# Patient Record
Sex: Female | Born: 1960
Health system: Southern US, Community
[De-identification: ages and names within clinical notes are randomized; demographics above are authoritative.]

## PROBLEM LIST (undated history)

## (undated) DIAGNOSIS — R55 Syncope and collapse: Secondary | ICD-10-CM

## (undated) DIAGNOSIS — M8580 Other specified disorders of bone density and structure, unspecified site: Secondary | ICD-10-CM

## (undated) DIAGNOSIS — F329 Major depressive disorder, single episode, unspecified: Secondary | ICD-10-CM

## (undated) DIAGNOSIS — E7800 Pure hypercholesterolemia, unspecified: Secondary | ICD-10-CM

## (undated) DIAGNOSIS — F32A Depression, unspecified: Secondary | ICD-10-CM

## (undated) DIAGNOSIS — C439 Malignant melanoma of skin, unspecified: Secondary | ICD-10-CM

## (undated) HISTORY — PX: TUBAL LIGATION: SHX77

## (undated) HISTORY — PX: MELANOMA EXCISION: SHX5266

## (undated) HISTORY — PX: OTHER SURGICAL HISTORY: SHX169

## (undated) HISTORY — DX: Pure hypercholesterolemia, unspecified: E78.00

## (undated) HISTORY — DX: Major depressive disorder, single episode, unspecified: F32.9

## (undated) HISTORY — DX: Depression, unspecified: F32.A

## (undated) HISTORY — DX: Other specified disorders of bone density and structure, unspecified site: M85.80

## (undated) HISTORY — PX: HYSTEROSCOPY: SHX211

## (undated) HISTORY — PX: DILATION AND CURETTAGE OF UTERUS: SHX78

## (undated) HISTORY — DX: Syncope and collapse: R55

## (undated) HISTORY — DX: Malignant melanoma of skin, unspecified: C43.9

## (undated) HISTORY — PX: BUNIONECTOMY: SHX129

---

## 1998-04-19 ENCOUNTER — Ambulatory Visit (HOSPITAL_COMMUNITY): Admission: RE | Admit: 1998-04-19 | Discharge: 1998-04-19 | Payer: Self-pay | Admitting: Obstetrics and Gynecology

## 1998-04-23 ENCOUNTER — Ambulatory Visit (HOSPITAL_COMMUNITY): Admission: RE | Admit: 1998-04-23 | Discharge: 1998-04-23 | Payer: Self-pay | Admitting: Obstetrics and Gynecology

## 1998-10-01 ENCOUNTER — Ambulatory Visit (HOSPITAL_COMMUNITY): Admission: RE | Admit: 1998-10-01 | Discharge: 1998-10-01 | Payer: Self-pay | Admitting: Family Medicine

## 1998-10-01 ENCOUNTER — Encounter: Payer: Self-pay | Admitting: Family Medicine

## 1999-08-01 HISTORY — PX: VAGINAL HYSTERECTOMY: SUR661

## 1999-10-18 ENCOUNTER — Ambulatory Visit (HOSPITAL_COMMUNITY): Admission: RE | Admit: 1999-10-18 | Discharge: 1999-10-18 | Payer: Self-pay | Admitting: Obstetrics and Gynecology

## 1999-10-18 ENCOUNTER — Encounter: Payer: Self-pay | Admitting: Obstetrics and Gynecology

## 2000-04-06 ENCOUNTER — Inpatient Hospital Stay (HOSPITAL_COMMUNITY): Admission: RE | Admit: 2000-04-06 | Discharge: 2000-04-07 | Payer: Self-pay | Admitting: Obstetrics and Gynecology

## 2000-10-23 ENCOUNTER — Ambulatory Visit (HOSPITAL_COMMUNITY): Admission: RE | Admit: 2000-10-23 | Discharge: 2000-10-23 | Payer: Self-pay | Admitting: Obstetrics and Gynecology

## 2000-10-23 ENCOUNTER — Encounter: Payer: Self-pay | Admitting: Obstetrics and Gynecology

## 2001-02-13 ENCOUNTER — Other Ambulatory Visit: Admission: RE | Admit: 2001-02-13 | Discharge: 2001-02-13 | Payer: Self-pay | Admitting: Obstetrics and Gynecology

## 2001-03-27 ENCOUNTER — Ambulatory Visit (HOSPITAL_BASED_OUTPATIENT_CLINIC_OR_DEPARTMENT_OTHER): Admission: RE | Admit: 2001-03-27 | Discharge: 2001-03-27 | Payer: Self-pay | Admitting: *Deleted

## 2001-12-17 ENCOUNTER — Encounter: Payer: Self-pay | Admitting: Obstetrics and Gynecology

## 2001-12-17 ENCOUNTER — Ambulatory Visit (HOSPITAL_COMMUNITY): Admission: RE | Admit: 2001-12-17 | Discharge: 2001-12-17 | Payer: Self-pay | Admitting: Obstetrics and Gynecology

## 2002-02-26 ENCOUNTER — Other Ambulatory Visit: Admission: RE | Admit: 2002-02-26 | Discharge: 2002-02-26 | Payer: Self-pay | Admitting: Obstetrics and Gynecology

## 2003-01-23 ENCOUNTER — Ambulatory Visit (HOSPITAL_COMMUNITY): Admission: RE | Admit: 2003-01-23 | Discharge: 2003-01-23 | Payer: Self-pay | Admitting: Obstetrics and Gynecology

## 2003-01-23 ENCOUNTER — Encounter: Payer: Self-pay | Admitting: Obstetrics and Gynecology

## 2003-03-25 ENCOUNTER — Other Ambulatory Visit: Admission: RE | Admit: 2003-03-25 | Discharge: 2003-03-25 | Payer: Self-pay | Admitting: Obstetrics and Gynecology

## 2004-01-26 ENCOUNTER — Ambulatory Visit (HOSPITAL_COMMUNITY): Admission: RE | Admit: 2004-01-26 | Discharge: 2004-01-26 | Payer: Self-pay | Admitting: Obstetrics and Gynecology

## 2004-05-04 ENCOUNTER — Other Ambulatory Visit: Admission: RE | Admit: 2004-05-04 | Discharge: 2004-05-04 | Payer: Self-pay | Admitting: Obstetrics and Gynecology

## 2005-01-30 ENCOUNTER — Ambulatory Visit (HOSPITAL_COMMUNITY): Admission: RE | Admit: 2005-01-30 | Discharge: 2005-01-30 | Payer: Self-pay | Admitting: Obstetrics and Gynecology

## 2005-05-05 ENCOUNTER — Other Ambulatory Visit: Admission: RE | Admit: 2005-05-05 | Discharge: 2005-05-05 | Payer: Self-pay | Admitting: Obstetrics and Gynecology

## 2006-04-04 ENCOUNTER — Ambulatory Visit (HOSPITAL_COMMUNITY): Admission: RE | Admit: 2006-04-04 | Discharge: 2006-04-04 | Payer: Self-pay | Admitting: Obstetrics and Gynecology

## 2006-05-09 ENCOUNTER — Other Ambulatory Visit: Admission: RE | Admit: 2006-05-09 | Discharge: 2006-05-09 | Payer: Self-pay | Admitting: Obstetrics and Gynecology

## 2007-04-10 ENCOUNTER — Ambulatory Visit (HOSPITAL_COMMUNITY): Admission: RE | Admit: 2007-04-10 | Discharge: 2007-04-10 | Payer: Self-pay | Admitting: Obstetrics and Gynecology

## 2007-05-15 ENCOUNTER — Other Ambulatory Visit: Admission: RE | Admit: 2007-05-15 | Discharge: 2007-05-15 | Payer: Self-pay | Admitting: Obstetrics and Gynecology

## 2007-08-19 ENCOUNTER — Ambulatory Visit: Payer: Self-pay | Admitting: Gastroenterology

## 2008-05-04 ENCOUNTER — Ambulatory Visit (HOSPITAL_COMMUNITY): Admission: RE | Admit: 2008-05-04 | Discharge: 2008-05-04 | Payer: Self-pay | Admitting: Obstetrics and Gynecology

## 2008-05-11 ENCOUNTER — Encounter: Admission: RE | Admit: 2008-05-11 | Discharge: 2008-05-11 | Payer: Self-pay | Admitting: Obstetrics and Gynecology

## 2008-05-25 ENCOUNTER — Encounter: Payer: Self-pay | Admitting: Obstetrics and Gynecology

## 2008-05-25 ENCOUNTER — Ambulatory Visit: Payer: Self-pay | Admitting: Obstetrics and Gynecology

## 2008-05-25 ENCOUNTER — Other Ambulatory Visit: Admission: RE | Admit: 2008-05-25 | Discharge: 2008-05-25 | Payer: Self-pay | Admitting: Obstetrics and Gynecology

## 2009-05-17 ENCOUNTER — Ambulatory Visit (HOSPITAL_COMMUNITY): Admission: RE | Admit: 2009-05-17 | Discharge: 2009-05-17 | Payer: Self-pay | Admitting: Obstetrics and Gynecology

## 2009-05-31 ENCOUNTER — Ambulatory Visit: Payer: Self-pay | Admitting: Obstetrics and Gynecology

## 2009-05-31 ENCOUNTER — Encounter: Payer: Self-pay | Admitting: Obstetrics and Gynecology

## 2009-05-31 ENCOUNTER — Other Ambulatory Visit: Admission: RE | Admit: 2009-05-31 | Discharge: 2009-05-31 | Payer: Self-pay | Admitting: Obstetrics and Gynecology

## 2009-06-30 ENCOUNTER — Ambulatory Visit: Payer: Self-pay | Admitting: Obstetrics and Gynecology

## 2010-05-18 ENCOUNTER — Ambulatory Visit (HOSPITAL_COMMUNITY): Admission: RE | Admit: 2010-05-18 | Discharge: 2010-05-18 | Payer: Self-pay | Admitting: Obstetrics and Gynecology

## 2010-06-01 ENCOUNTER — Ambulatory Visit: Payer: Self-pay | Admitting: Obstetrics and Gynecology

## 2010-06-01 ENCOUNTER — Other Ambulatory Visit: Admission: RE | Admit: 2010-06-01 | Discharge: 2010-06-01 | Payer: Self-pay | Admitting: Obstetrics and Gynecology

## 2010-08-21 ENCOUNTER — Encounter: Payer: Self-pay | Admitting: Orthopedic Surgery

## 2010-08-21 ENCOUNTER — Encounter: Payer: Self-pay | Admitting: Family Medicine

## 2010-08-28 ENCOUNTER — Encounter
Admission: RE | Admit: 2010-08-28 | Discharge: 2010-08-28 | Payer: Self-pay | Source: Home / Self Care | Attending: Orthopedic Surgery | Admitting: Orthopedic Surgery

## 2010-10-17 ENCOUNTER — Other Ambulatory Visit: Payer: Self-pay | Admitting: Dermatology

## 2010-12-16 NOTE — Op Note (Signed)
[facility]  Patient:    Judy Mejia, Judy Mejia                    MRN: [id number] Proc. Date: 04/06/00 Adm. Date:  [id number] Attending:  Sharon Mt                           Operative Report  PREOPERATIVE DIAGNOSES:  Dysfunctional uterine bleeding, dysmenorrhea, adenomyosis suspected.  POSTOPERATIVE DIAGNOSES:  Dysfunctional uterine bleeding, dysmenorrhea, adenomyosis suspected.  OPERATION PERFORMED:  Vaginal hysterectomy.  SURGEON:  Dr. Eda Paschal.  FIRST ASSISTANT:  Dr. Audie Box.  ANESTHESIA:  General endotracheal.  FINDINGS:  External and vaginal is within normal limits, cervix is clean. The uterus is normal size and shape. The tubes and ovaries were free of any disease. The pelvic peritoneum was free of any disease.  DESCRIPTION OF PROCEDURE:  After adequate general endotracheal anesthesia, the patient was placed in the dorsal lithotomy position, prepped and draped in the usual sterile fashion. A 1:200,000 solution of epinephrine and 0.5% xylocaine was injected around the cervix. A 360 degree incision was made around the cervix, the bladder was mobilized superiorly as was the posterior peritoneum. The posterior peritoneum and vesicouterine fold to peritoneum were entered by sharp dissection. The uterosacral ligaments were clamped. In clamping them, they were shortened. They were then sutured to the vaginal cuff laterally for good vault support. The cardinal ligaments, uterine arteries, balance of the broad ligament, utero-ovarian ligaments, round ligaments, and fallopian tubes were all successfully clamped, cut and suture ligated. All major vascular bundles were doubly ligated. The suture material for all the above mentioned pedicles was #1 chromic catgut. The uterus was delivered intact, sent to pathology for tissue diagnosis. The adnexa were free of any disease. The vaginal cuff was sutured to the posterior peritoneum with a running  #0 Vicryl. Copious irrigation was done with Ringers lactate. A modified McCall enterocele prevention suture was placed with 2-0 Vicryl and then the cuff and peritoneum was closed with interrupteds of #1 chromic catgut. The enterocele prevention suture was tied in place. The bladder was emptied and drained clear urine and it was removed. The patient left the operating room in satisfactory condition. DD:  04/06/00 TD:  04/07/00 Job: [id number]

## 2010-12-16 NOTE — Op Note (Signed)
[place]  Patient:    Judy Mejia, Judy Mejia Visit Number: [id number] MRN: [id number]          Service Type: DSU Location: [facility] Attending Physician:  Vikki Ports Proc. Date: 03/27/01 Adm. Date:  03/27/2001                             Operative Report  PREOPERATIVE DIAGNOSIS:  Fistula in ano.  POSTOPERATIVE DIAGNOSIS:  Fistula in ano.  PROCEDURE:  Fistulotomy.  SURGEON:  Vikki Ports, M.D.  ANESTHESIA:  General.  DESCRIPTION OF PROCEDURE:  The patient was taken to the operating room and placed in supine position after adequate anesthesia was induced.  Using laryngeal mask the patient was placed in lithotomy position.  Perianal prep was undertaken.  A Hill-Ferguson retractor was placed within the rectum. External fistula opening was identified, probed with a rectal probe, found to go submuscularly behind the internal sphincter.  This was opened with Bovie electrocautery.  All tissues were injected 0.5 Marcaine with epinephrine. Adequate hemostasis was insured.  Gelfoam packing was placed.  The patient tolerated the procedure well and went to PACU in good condition. Attending Physician:  Vikki Ports DD:  03/27/01 TD:  03/27/01 Job: [id number]

## 2010-12-16 NOTE — Discharge Summary (Signed)
[facility]  Patient:    Judy Mejia, Judy Mejia                    MRN: [id number] Adm. Date:  [id number] Disc. Date: [id number] Attending:  Sharon Mt                           Discharge Summary  HISTORY OF PRESENT ILLNESS:  The patient is a 50 year old female with refractory dysfunction uterine bleeding and dysmenorrhea with adenomyosis suspected who entered the hospital for a vaginal hysterectomy.  HOSPITAL COURSE:  On the day of admission a vaginal hysterectomy was performed without difficulty.  On the first postoperative day she was voiding well, tolerating an oral diet, and was without complaints.  She was discharged on Tylox for pain relief.  Diet was soft, to advance as tolerated.  Activity was ambulatory.  Final pathology report is not ready at the time of dictation. She is to return to our office in two weeks for follow-up.  She will call for an appointment.  CONDITION ON DISCHARGE:  Improved.  DISCHARGE DIAGNOSES: 1. Dysfunctional uterine bleeding. 2. Dysmenorrhea. 3. Adenomyosis suspected.  OPERATIONS:  Vaginal hysterectomy. DD:  04/07/00 TD:  04/09/00 Job: [id number]

## 2010-12-16 NOTE — H&P (Signed)
[facility]  Patient:    Judy Mejia, Judy Mejia                         MRN: [id number] Attending:  Rande Brunt. Eda Paschal, M.D.                         History and Physical  CHIEF COMPLAINT:  Menometrorrhagia and dysmenorrhea.  HISTORY OF PRESENT ILLNESS:  The patient is a 50 year old gravida 2, para 2, AB 0, who persists in having severe dysmenorrhea as well as severe menorrhagia.  This has become more and more incapacitating for her.  She has been treated with several different types of oral contraceptives without relief.  She has used nonsteroidal anti-inflammatory drugs as well without any relief.  She had a sonohysterogram, which failed to reveal any significant intrauterine pathology to explain the above.  It is felt that her most likely diagnosis is adenomyosis.  As a result of persistent menometrorrhagia, dysmenorrhea nonresponsive to medical therapy, she now enters the hospital for a vaginal hysterectomy.  She has given me permission to remove one or both of her ovaries for very significant, though our plan since she is only 58 is to try to preserve both her ovaries.  PAST MEDICAL HISTORY:  She is currently on Zoloft and Wellbutrin for depression.  She also takes cyclobenzaprine.  She has only had bunion surgery.  ALLERGIES:  She is allergic to no drugs.  FAMILY HISTORY:  Negative for breast, colon, ovarian, or uterine cancer. Negative for diabetes, hypertension, and heart disease.  SOCIAL HISTORY:  She is a nonsmoker.  She rarely uses alcohol.  She has one up of coffee per day and one coke per day.  REVIEW OF SYSTEMS:  HEENT:  Negative except for a history of headaches and lightheadedness.  CARDIAC:  Negative.  GASTROINTESTINAL:  Gas and midepigastric discomfort.  GENITOURINARY:  Negative.  NEUROMUSCULAR:  History of depression. ALLERGIC, IMMUNOLOGIC, LYMPHATIC, ENDOCRINE:  Basically negative.  PHYSICAL EXAMINATION:  GENERAL:  The patient is a  well-developed and well-nourished female in no acute distress.  VITAL SIGNS:  Blood pressure is 118/76, pulse is 80 and regular, respirations 16 and nonlabored.  She is afebrile.  HEENT:  All within normal limits.  NECK:  Supple, trachea was in the midline.  Thyroid was not enlarged.  LUNGS:  Clear to P&A.  HEART:  No thrills, heaves, or murmurs.  BREASTS:  No masses.  ABDOMEN:  Soft without guarding, rebound, or masses.  PELVIC:  External and vaginal is within normal limits.  Cervix is clean. Uterus is anteverted and normal size and shape.  Adnexa are palpably normal. Rectal is negative.  EXTREMITIES:  Within normal limits.  DIAGNOSTIC EVALUATION:  Please note that although the patient had a normal sonohysterogram now, in July 1998 she did have an endometrial polyp, which was removed by [facility] and hysteroscopy, but her above problems have persisted.  IMPRESSION:  Persistent menometrorrhagia and dysmenorrhea.  PLAN:  Vaginal hysterectomy for treatment of the above. DD:  04/05/00 TD:  04/06/00 Job: [id number]

## 2011-05-02 ENCOUNTER — Other Ambulatory Visit: Payer: Self-pay | Admitting: Obstetrics and Gynecology

## 2011-05-02 DIAGNOSIS — Z1231 Encounter for screening mammogram for malignant neoplasm of breast: Secondary | ICD-10-CM

## 2011-05-31 ENCOUNTER — Encounter: Payer: Self-pay | Admitting: Gynecology

## 2011-06-09 ENCOUNTER — Ambulatory Visit (INDEPENDENT_AMBULATORY_CARE_PROVIDER_SITE_OTHER): Payer: Managed Care, Other (non HMO) | Admitting: Obstetrics and Gynecology

## 2011-06-09 ENCOUNTER — Other Ambulatory Visit (HOSPITAL_COMMUNITY)
Admission: RE | Admit: 2011-06-09 | Discharge: 2011-06-09 | Disposition: A | Discharge status: Home / Self Care | Payer: Managed Care, Other (non HMO) | Source: Ambulatory Visit | Attending: Obstetrics and Gynecology | Admitting: Obstetrics and Gynecology

## 2011-06-09 ENCOUNTER — Encounter: Payer: Self-pay | Admitting: Obstetrics and Gynecology

## 2011-06-09 ENCOUNTER — Ambulatory Visit (HOSPITAL_COMMUNITY)
Admission: RE | Admit: 2011-06-09 | Discharge: 2011-06-09 | Disposition: A | Discharge status: Home / Self Care | Payer: Managed Care, Other (non HMO) | Source: Ambulatory Visit | Attending: Obstetrics and Gynecology | Admitting: Obstetrics and Gynecology

## 2011-06-09 VITALS — BP 120/70 | Ht 63.0 in | Wt 123.0 lb

## 2011-06-09 DIAGNOSIS — Z01419 Encounter for gynecological examination (general) (routine) without abnormal findings: Secondary | ICD-10-CM | POA: Insufficient documentation

## 2011-06-09 DIAGNOSIS — R140 Abdominal distension (gaseous): Secondary | ICD-10-CM | POA: Insufficient documentation

## 2011-06-09 DIAGNOSIS — M8580 Other specified disorders of bone density and structure, unspecified site: Secondary | ICD-10-CM

## 2011-06-09 DIAGNOSIS — M899 Disorder of bone, unspecified: Secondary | ICD-10-CM

## 2011-06-09 DIAGNOSIS — R141 Gas pain: Secondary | ICD-10-CM

## 2011-06-09 DIAGNOSIS — E7800 Pure hypercholesterolemia, unspecified: Secondary | ICD-10-CM

## 2011-06-09 DIAGNOSIS — Z833 Family history of diabetes mellitus: Secondary | ICD-10-CM

## 2011-06-09 DIAGNOSIS — Z1231 Encounter for screening mammogram for malignant neoplasm of breast: Secondary | ICD-10-CM | POA: Insufficient documentation

## 2011-06-09 NOTE — Progress Notes (Signed)
The patient came back to see me today for her annual GYN exam. For a while now she's been treated for abdominal bloating by her GI doctor. She is still having a problem as the medication so far has not worked. She has low bone mass without an elevated FRAX risk. She is due for followup bone density. She is up-to-date on mammograms. Several years ago she had an elevated FSH but is asymptomatic. Last year her lipid profile showed an elevated triglyceride 179 and elevated LDL of 149( total cholesterol 234). She did not fast today. She is having no vaginal bleeding.  HEENT: Within normal limits. Kennon Portela present Neck: No masses. Supraclavicular lymph nodes: Not enlarged. Breasts: Examined in both sitting and lying position. Symmetrical without skin changes or masses. Abdomen: Soft no masses guarding or rebound. No hernias. Pelvic: External within normal limits. BUS within normal limits. Vaginal examination shows good estrogen effect, no cystocele enterocele or rectocele. Cervix and uterus absent. Adnexa within normal limits. Rectovaginal confirmatory. Extremities within normal limits.   Assessment: Abdominal bloating. Elevated cholesterol. Osteopenia.  Plan: Return for bone density, pelvic ultrasound, and lab work when she is fasted. Please note that I could not find this year's  mammogram in  chart. We will ask her about a when I see her for ultrasound.

## 2011-06-15 ENCOUNTER — Other Ambulatory Visit: Payer: Self-pay | Admitting: Gastroenterology

## 2011-06-15 ENCOUNTER — Ambulatory Visit (INDEPENDENT_AMBULATORY_CARE_PROVIDER_SITE_OTHER): Payer: Managed Care, Other (non HMO) | Admitting: Obstetrics and Gynecology

## 2011-06-15 DIAGNOSIS — M949 Disorder of cartilage, unspecified: Secondary | ICD-10-CM

## 2011-06-15 DIAGNOSIS — Z833 Family history of diabetes mellitus: Secondary | ICD-10-CM

## 2011-06-15 DIAGNOSIS — E7800 Pure hypercholesterolemia, unspecified: Secondary | ICD-10-CM

## 2011-06-15 DIAGNOSIS — M899 Disorder of bone, unspecified: Secondary | ICD-10-CM

## 2011-06-15 DIAGNOSIS — M8580 Other specified disorders of bone density and structure, unspecified site: Secondary | ICD-10-CM

## 2011-06-15 NOTE — Progress Notes (Signed)
Addended by: Landis Martins R on: 06/15/2011 10:04 AM   Modules accepted: Orders

## 2011-06-27 ENCOUNTER — Other Ambulatory Visit: Payer: Managed Care, Other (non HMO)

## 2011-06-29 ENCOUNTER — Ambulatory Visit
Admission: RE | Admit: 2011-06-29 | Discharge: 2011-06-29 | Disposition: A | Discharge status: Home / Self Care | Payer: Managed Care, Other (non HMO) | Source: Ambulatory Visit | Attending: Gastroenterology | Admitting: Gastroenterology

## 2011-06-29 MED ORDER — IOHEXOL 300 MG/ML  SOLN
100.0000 mL | Freq: Once | INTRAMUSCULAR | Status: AC | PRN
  Administered 2011-06-29: 100 mL via INTRAVENOUS

## 2011-07-05 ENCOUNTER — Ambulatory Visit: Payer: Managed Care, Other (non HMO) | Admitting: Obstetrics and Gynecology

## 2011-07-05 ENCOUNTER — Other Ambulatory Visit: Payer: Managed Care, Other (non HMO)

## 2011-07-13 ENCOUNTER — Ambulatory Visit (INDEPENDENT_AMBULATORY_CARE_PROVIDER_SITE_OTHER): Payer: Managed Care, Other (non HMO)

## 2011-07-13 DIAGNOSIS — M8580 Other specified disorders of bone density and structure, unspecified site: Secondary | ICD-10-CM

## 2011-07-13 DIAGNOSIS — M899 Disorder of bone, unspecified: Secondary | ICD-10-CM

## 2011-07-20 ENCOUNTER — Ambulatory Visit: Payer: Managed Care, Other (non HMO) | Admitting: Obstetrics and Gynecology

## 2011-07-20 ENCOUNTER — Other Ambulatory Visit: Payer: Managed Care, Other (non HMO)

## 2012-05-01 ENCOUNTER — Other Ambulatory Visit: Payer: Self-pay | Admitting: Obstetrics and Gynecology

## 2012-05-01 DIAGNOSIS — Z1231 Encounter for screening mammogram for malignant neoplasm of breast: Secondary | ICD-10-CM

## 2012-06-21 ENCOUNTER — Ambulatory Visit (HOSPITAL_COMMUNITY)
Admission: RE | Admit: 2012-06-21 | Discharge: 2012-06-21 | Disposition: A | Discharge status: Home / Self Care | Payer: Managed Care, Other (non HMO) | Source: Ambulatory Visit | Attending: Obstetrics and Gynecology | Admitting: Obstetrics and Gynecology

## 2012-06-21 ENCOUNTER — Ambulatory Visit (INDEPENDENT_AMBULATORY_CARE_PROVIDER_SITE_OTHER): Payer: Managed Care, Other (non HMO) | Admitting: Obstetrics and Gynecology

## 2012-06-21 ENCOUNTER — Encounter: Payer: Self-pay | Admitting: Obstetrics and Gynecology

## 2012-06-21 VITALS — BP 112/66 | Ht 63.0 in | Wt 126.0 lb

## 2012-06-21 DIAGNOSIS — F32A Depression, unspecified: Secondary | ICD-10-CM | POA: Insufficient documentation

## 2012-06-21 DIAGNOSIS — Z1231 Encounter for screening mammogram for malignant neoplasm of breast: Secondary | ICD-10-CM | POA: Insufficient documentation

## 2012-06-21 DIAGNOSIS — F329 Major depressive disorder, single episode, unspecified: Secondary | ICD-10-CM | POA: Insufficient documentation

## 2012-06-21 DIAGNOSIS — N8111 Cystocele, midline: Secondary | ICD-10-CM

## 2012-06-21 DIAGNOSIS — Z01419 Encounter for gynecological examination (general) (routine) without abnormal findings: Secondary | ICD-10-CM

## 2012-06-21 DIAGNOSIS — IMO0002 Reserved for concepts with insufficient information to code with codable children: Secondary | ICD-10-CM

## 2012-06-21 MED ORDER — ESTRADIOL 0.05 MG/24HR TD PTWK: 1.0000 | MEDICATED_PATCH | TRANSDERMAL | Status: DC

## 2012-06-21 NOTE — Patient Instructions (Signed)
Bone density in December, 2014. Continue yearly mammograms.

## 2012-06-21 NOTE — Progress Notes (Signed)
Patient came to see me today for her annual GYN exam. In 2009 she had an elevated FSH. She is now complaining of hot flashes. She has tried over-the-counter products without help. She is having no vaginal bleeding or pelvic pain. She had a vaginal hysterectomy for dysfunctional uterine bleeding in 2001. She has never had an abnormal Pap smear. She has osteopenia on bone density without an elevated fracture risk. She's had no fractures. Her bone density was in 2012. She's never had an abnormal Pap smear. Her last Pap smear was 2012. She is noticing that sometimes she has trouble emptying her bladder. She also is having more trouble with vaginal dryness. She does her lab through her PCP. She had her mammogram today.  HEENT: Within normal limits.Kennon Portela present. Neck: No masses. Supraclavicular lymph nodes: Not enlarged. Breasts: Examined in both sitting and lying position. Symmetrical without skin changes or masses. Abdomen: Soft no masses guarding or rebound. No hernias. Pelvic: External within normal limits. BUS within normal limits. Vaginal examination shows good estrogen effect, first degree cystocele. no  enterocele or rectocele. Cervix and uterus absent. Adnexa within normal limits. Rectovaginal confirmatory. Extremities within normal limits.  Assessment: #1. Hot flashes and vaginal dryness. #2. Some trouble emptying her bladder with small cystocele. #3. Osteopenia  Plan: Continue yearly mammograms. Bone density 2014. Climara patch 0.05 mg weekly. Discussed cystocele. Possibly estrogen will help the voiding. Discussed Flomax if needed. Pap not done.The new Pap smear guidelines were discussed with the patient.

## 2012-06-22 LAB — URINALYSIS W MICROSCOPIC + REFLEX CULTURE
Bilirubin Urine: NEGATIVE
Casts: NONE SEEN
Glucose, UA: NEGATIVE mg/dL
Hgb urine dipstick: NEGATIVE
Protein, ur: NEGATIVE mg/dL
pH: 7 (ref 5.0–8.0)

## 2012-10-10 ENCOUNTER — Other Ambulatory Visit: Payer: Self-pay | Admitting: Dermatology

## 2013-05-23 ENCOUNTER — Other Ambulatory Visit: Payer: Self-pay | Admitting: Gynecology

## 2013-05-23 DIAGNOSIS — Z1231 Encounter for screening mammogram for malignant neoplasm of breast: Secondary | ICD-10-CM

## 2013-07-08 ENCOUNTER — Telehealth: Payer: Self-pay | Admitting: Gynecology

## 2013-07-08 ENCOUNTER — Encounter: Payer: Self-pay | Admitting: Gynecology

## 2013-07-08 ENCOUNTER — Ambulatory Visit (INDEPENDENT_AMBULATORY_CARE_PROVIDER_SITE_OTHER): Payer: Managed Care, Other (non HMO) | Admitting: Gynecology

## 2013-07-08 ENCOUNTER — Ambulatory Visit (HOSPITAL_COMMUNITY)
Admission: RE | Admit: 2013-07-08 | Discharge: 2013-07-08 | Disposition: A | Discharge status: Home / Self Care | Payer: Managed Care, Other (non HMO) | Source: Ambulatory Visit | Attending: Gynecology | Admitting: Gynecology

## 2013-07-08 VITALS — BP 120/74 | Ht 64.0 in | Wt 123.0 lb

## 2013-07-08 DIAGNOSIS — M8580 Other specified disorders of bone density and structure, unspecified site: Secondary | ICD-10-CM

## 2013-07-08 DIAGNOSIS — Z1231 Encounter for screening mammogram for malignant neoplasm of breast: Secondary | ICD-10-CM | POA: Insufficient documentation

## 2013-07-08 DIAGNOSIS — M899 Disorder of bone, unspecified: Secondary | ICD-10-CM

## 2013-07-08 DIAGNOSIS — Z01419 Encounter for gynecological examination (general) (routine) without abnormal findings: Secondary | ICD-10-CM

## 2013-07-08 NOTE — Patient Instructions (Signed)
Follow up for Bone density as scheduled Follow up for annual exam in one year

## 2013-07-08 NOTE — Telephone Encounter (Signed)
Left the below on pt voicemail and asked her to call me either way to confirm if colonoscopy has been done.

## 2013-07-08 NOTE — Telephone Encounter (Signed)
Call patient and ask her about whether she has had a colonoscopy before. I forgot to ask her this at her annual exam. If she has never had a colonoscopy then we recommend she schedule a screening colonoscopy either through [facility] gastroenterology or [facility] gastroenterology.

## 2013-07-08 NOTE — Progress Notes (Signed)
Judy Mejia [birth year] [id number]        52 y.o.  [id number] for annual exam.  Former patient of Dr. Eda Paschal. Several issues discussed below.  Past medical history,surgical history, problem list, medications, allergies, family history and social history were all reviewed and documented in the EPIC chart.  ROS:  Performed and pertinent positives and negatives are included in the history, assessment and plan .  Exam: Kim assistant Filed Vitals:   07/08/13 1053  BP: 120/74  Height: 5\' 4"  (1.626 m)  Weight: 123 lb (55.792 kg)   General appearance  Normal Skin grossly normal Head/Neck normal with no cervical or supraclavicular adenopathy thyroid normal Lungs  clear Cardiac RR, without RMG Abdominal  soft, nontender, without masses, organomegaly or hernia Breasts  examined lying and sitting without masses, retractions, discharge or axillary adenopathy. Pelvic  Ext/BUS/vagina  Normal with mild atrophic changes  Adnexa  Without masses or tenderness    Anus and perineum  Normal   Rectovaginal  Normal sphincter tone without palpated masses or tenderness.    Assessment/Plan:  52 y.o. [id number] female for annual exam.   1. History of TVH 2001 for DUB. Having some hot flushes and night sweats. Discussed HRT with Dr. Eda Paschal last year was given prescription for Climara patch but never started it. States that her hot flashes are getting better and she does not want HRT. OTC options were reviewed with her. Not having vaginal dryness or dyspareunia. Will followup if any issues. 2. Osteopenia. DEXA 07/2011 with T score -2.0. FRAX 5.8%/0.8%. Recommend repeat DEXA now and patient will schedule. Increase calcium and vitamin D reviewed. 3. Pap smear 2012. No Pap smear done today. No history of abnormal Pap smears previously. Discussed current screening guidelines. She is status post hysterectomy for benign indications. Options to stop screening altogether versus less frequent screening intervals  reviewed. Will readdress on an annual basis. 4. Mammography today. Continue with annual mammography. SBE monthly reviewed. 5. Colonoscopy. I forgot to ask the patient about her colonoscopy. I will have the office staff contact her and verify that she had done and if not then recommend a screening colonoscopy. 6. Health maintenance. No blood work done as it's reportedly done through her primary physician's office. Followup one year, sooner as needed.   Note: This document was prepared with digital dictation and possible smart phrase technology. Any transcriptional errors that result from this process are unintentional.   Dara Lords MD, 11:19 AM 07/08/2013

## 2013-07-09 LAB — URINALYSIS W MICROSCOPIC + REFLEX CULTURE
Bacteria, UA: NONE SEEN
Casts: NONE SEEN
Crystals: NONE SEEN
Hgb urine dipstick: NEGATIVE
Leukocytes, UA: NEGATIVE
Nitrite: NEGATIVE
Specific Gravity, Urine: 1.018 (ref 1.005–1.030)
Squamous Epithelial / LPF: NONE SEEN
Urobilinogen, UA: 0.2 mg/dL (ref 0.0–1.0)
pH: 6 (ref 5.0–8.0)

## 2013-07-11 NOTE — Telephone Encounter (Signed)
Pt husband called back and said pt has colonoscopy done in 2010 with Dr.Outlaw

## 2013-09-09 ENCOUNTER — Ambulatory Visit (INDEPENDENT_AMBULATORY_CARE_PROVIDER_SITE_OTHER): Payer: Managed Care, Other (non HMO)

## 2013-09-09 DIAGNOSIS — M949 Disorder of cartilage, unspecified: Secondary | ICD-10-CM

## 2013-09-09 DIAGNOSIS — M899 Disorder of bone, unspecified: Secondary | ICD-10-CM

## 2013-09-09 DIAGNOSIS — M8580 Other specified disorders of bone density and structure, unspecified site: Secondary | ICD-10-CM

## 2013-09-10 ENCOUNTER — Encounter: Payer: Self-pay | Admitting: Gynecology

## 2013-09-29 ENCOUNTER — Other Ambulatory Visit: Payer: Self-pay | Admitting: *Deleted

## 2013-09-29 DIAGNOSIS — M8580 Other specified disorders of bone density and structure, unspecified site: Secondary | ICD-10-CM

## 2014-05-29 ENCOUNTER — Other Ambulatory Visit: Payer: Self-pay | Admitting: Gynecology

## 2014-05-29 DIAGNOSIS — Z1231 Encounter for screening mammogram for malignant neoplasm of breast: Secondary | ICD-10-CM

## 2014-06-01 ENCOUNTER — Encounter: Payer: Self-pay | Admitting: Gynecology

## 2014-07-17 ENCOUNTER — Ambulatory Visit (HOSPITAL_COMMUNITY)
Admission: RE | Admit: 2014-07-17 | Discharge: 2014-07-17 | Disposition: A | Discharge status: Home / Self Care | Payer: Managed Care, Other (non HMO) | Source: Ambulatory Visit | Attending: Gynecology | Admitting: Gynecology

## 2014-07-17 ENCOUNTER — Other Ambulatory Visit: Payer: Self-pay | Admitting: Gynecology

## 2014-07-17 DIAGNOSIS — Z1231 Encounter for screening mammogram for malignant neoplasm of breast: Secondary | ICD-10-CM

## 2014-08-05 ENCOUNTER — Other Ambulatory Visit (HOSPITAL_COMMUNITY)
Admission: RE | Admit: 2014-08-05 | Discharge: 2014-08-05 | Disposition: A | Discharge status: Home / Self Care | Payer: BLUE CROSS/BLUE SHIELD | Source: Ambulatory Visit | Attending: Gynecology | Admitting: Gynecology

## 2014-08-05 ENCOUNTER — Ambulatory Visit (INDEPENDENT_AMBULATORY_CARE_PROVIDER_SITE_OTHER): Payer: BLUE CROSS/BLUE SHIELD | Admitting: Gynecology

## 2014-08-05 ENCOUNTER — Encounter: Payer: Self-pay | Admitting: Gynecology

## 2014-08-05 VITALS — BP 130/76 | Ht 63.0 in | Wt 124.0 lb

## 2014-08-05 DIAGNOSIS — M8580 Other specified disorders of bone density and structure, unspecified site: Secondary | ICD-10-CM

## 2014-08-05 DIAGNOSIS — Z01419 Encounter for gynecological examination (general) (routine) without abnormal findings: Secondary | ICD-10-CM | POA: Insufficient documentation

## 2014-08-05 NOTE — Patient Instructions (Signed)
You may obtain a copy of any labs that were done today by logging onto MyChart as outlined in the instructions provided with your AVS (after visit summary). The office will not call with normal lab results but certainly if there are any significant abnormalities then we will contact you.   Health Maintenance, Female A healthy lifestyle and preventative care can promote health and wellness.  Maintain regular health, dental, and eye exams.  Eat a healthy diet. Foods like vegetables, fruits, whole grains, low-fat dairy products, and lean protein foods contain the nutrients you need without too many calories. Decrease your intake of foods high in solid fats, added sugars, and salt. Get information about a proper diet from your caregiver, if necessary.  Regular physical exercise is one of the most important things you can do for your health. Most adults should get at least 150 minutes of moderate-intensity exercise (any activity that increases your heart rate and causes you to sweat) each week. In addition, most adults need muscle-strengthening exercises on 2 or more days a week.   Maintain a healthy weight. The body mass index (BMI) is a screening tool to identify possible weight problems. It provides an estimate of body fat based on height and weight. Your caregiver can help determine your BMI, and can help you achieve or maintain a healthy weight. For adults 20 years and older:  A BMI below 18.5 is considered underweight.  A BMI of 18.5 to 24.9 is normal.  A BMI of 25 to 29.9 is considered overweight.  A BMI of 30 and above is considered obese.  Maintain normal blood lipids and cholesterol by exercising and minimizing your intake of saturated fat. Eat a balanced diet with plenty of fruits and vegetables. Blood tests for lipids and cholesterol should begin at age 61 and be repeated every 5 years. If your lipid or cholesterol levels are high, you are over 50, or you are a high risk for heart  disease, you may need your cholesterol levels checked more frequently.Ongoing high lipid and cholesterol levels should be treated with medicines if diet and exercise are not effective.  If you smoke, find out from your caregiver how to quit. If you do not use tobacco, do not start.  Lung cancer screening is recommended for adults aged 33 80 years who are at high risk for developing lung cancer because of a history of smoking. Yearly low-dose computed tomography (CT) is recommended for people who have at least a 30-pack-year history of smoking and are a current smoker or have quit within the past 15 years. A pack year of smoking is smoking an average of 1 pack of cigarettes a day for 1 year (for example: 1 pack a day for 30 years or 2 packs a day for 15 years). Yearly screening should continue until the smoker has stopped smoking for at least 15 years. Yearly screening should also be stopped for people who develop a health problem that would prevent them from having lung cancer treatment.  If you are pregnant, do not drink alcohol. If you are breastfeeding, be very cautious about drinking alcohol. If you are not pregnant and choose to drink alcohol, do not exceed 1 drink per day. One drink is considered to be 12 ounces (355 mL) of beer, 5 ounces (148 mL) of wine, or 1.5 ounces (44 mL) of liquor.  Avoid use of street drugs. Do not share needles with anyone. Ask for help if you need support or instructions about stopping  the use of drugs.  High blood pressure causes heart disease and increases the risk of stroke. Blood pressure should be checked at least every 1 to 2 years. Ongoing high blood pressure should be treated with medicines, if weight loss and exercise are not effective.  If you are 59 to 56 years old, ask your caregiver if you should take aspirin to prevent strokes.  Diabetes screening involves taking a blood sample to check your fasting blood sugar level. This should be done once every 3  years, after age 91, if you are within normal weight and without risk factors for diabetes. Testing should be considered at a younger age or be carried out more frequently if you are overweight and have at least 1 risk factor for diabetes.  Breast cancer screening is essential preventative care for women. You should practice "breast self-awareness." This means understanding the normal appearance and feel of your breasts and may include breast self-examination. Any changes detected, no matter how small, should be reported to a caregiver. Women in their 66s and 30s should have a clinical breast exam (CBE) by a caregiver as part of a regular health exam every 1 to 3 years. After age 101, women should have a CBE every year. Starting at age 100, women should consider having a mammogram (breast X-ray) every year. Women who have a family history of breast cancer should talk to their caregiver about genetic screening. Women at a high risk of breast cancer should talk to their caregiver about having an MRI and a mammogram every year.  Breast cancer gene (BRCA)-related cancer risk assessment is recommended for women who have family members with BRCA-related cancers. BRCA-related cancers include breast, ovarian, tubal, and peritoneal cancers. Having family members with these cancers may be associated with an increased risk for harmful changes (mutations) in the breast cancer genes BRCA1 and BRCA2. Results of the assessment will determine the need for genetic counseling and BRCA1 and BRCA2 testing.  The Pap test is a screening test for cervical cancer. Women should have a Pap test starting at age 57. Between ages 25 and 35, Pap tests should be repeated every 2 years. Beginning at age 37, you should have a Pap test every 3 years as long as the past 3 Pap tests have been normal. If you had a hysterectomy for a problem that was not cancer or a condition that could lead to cancer, then you no longer need Pap tests. If you are  between ages 50 and 76, and you have had normal Pap tests going back 10 years, you no longer need Pap tests. If you have had past treatment for cervical cancer or a condition that could lead to cancer, you need Pap tests and screening for cancer for at least 20 years after your treatment. If Pap tests have been discontinued, risk factors (such as a new sexual partner) need to be reassessed to determine if screening should be resumed. Some women have medical problems that increase the chance of getting cervical cancer. In these cases, your caregiver may recommend more frequent screening and Pap tests.  The human papillomavirus (HPV) test is an additional test that may be used for cervical cancer screening. The HPV test looks for the virus that can cause the cell changes on the cervix. The cells collected during the Pap test can be tested for HPV. The HPV test could be used to screen women aged 44 years and older, and should be used in women of any age  who have unclear Pap test results. After the age of 55, women should have HPV testing at the same frequency as a Pap test.  Colorectal cancer can be detected and often prevented. Most routine colorectal cancer screening begins at the age of 44 and continues through age 20. However, your caregiver may recommend screening at an earlier age if you have risk factors for colon cancer. On a yearly basis, your caregiver may provide home test kits to check for hidden blood in the stool. Use of a small camera at the end of a tube, to directly examine the colon (sigmoidoscopy or colonoscopy), can detect the earliest forms of colorectal cancer. Talk to your caregiver about this at age 86, when routine screening begins. Direct examination of the colon should be repeated every 5 to 10 years through age 13, unless early forms of pre-cancerous polyps or small growths are found.  Hepatitis C blood testing is recommended for all people born from [birth year] through 1967 and any  individual with known risks for hepatitis C.  Practice safe sex. Use condoms and avoid high-risk sexual practices to reduce the spread of sexually transmitted infections (STIs). Sexually active women aged 36 and younger should be checked for Chlamydia, which is a common sexually transmitted infection. Older women with new or multiple partners should also be tested for Chlamydia. Testing for other STIs is recommended if you are sexually active and at increased risk.  Osteoporosis is a disease in which the bones lose minerals and strength with aging. This can result in serious bone fractures. The risk of osteoporosis can be identified using a bone density scan. Women ages 20 and over and women at risk for fractures or osteoporosis should discuss screening with their caregivers. Ask your caregiver whether you should be taking a calcium supplement or vitamin D to reduce the rate of osteoporosis.  Menopause can be associated with physical symptoms and risks. Hormone replacement therapy is available to decrease symptoms and risks. You should talk to your caregiver about whether hormone replacement therapy is right for you.  Use sunscreen. Apply sunscreen liberally and repeatedly throughout the day. You should seek shade when your shadow is shorter than you. Protect yourself by wearing long sleeves, pants, a wide-brimmed hat, and sunglasses year round, whenever you are outdoors.  Notify your caregiver of new moles or changes in moles, especially if there is a change in shape or color. Also notify your caregiver if a mole is larger than the size of a pencil eraser.  Stay current with your immunizations. Document Released: 01/30/2011 Document Revised: 11/11/2012 Document Reviewed: 01/30/2011 [facility] Patient Information 2014 [employer].

## 2014-08-05 NOTE — Progress Notes (Signed)
Judy Mejia 1960-11-16 [id number]        54 y.o.  G2P2002 for annual exam.  Several issues noted below.  Past medical history,surgical history, problem list, medications, allergies, family history and social history were all reviewed and documented as reviewed in the EPIC chart.  ROS:  Performed with pertinent positives and negatives included in the history, assessment and plan.   Additional significant findings :  none   Exam: Kim Counsellor Vitals:   08/05/14 0817  BP: 130/76  Height: 5\' 3"  (1.6 m)  Weight: 124 lb (56.246 kg)   General appearance:  Normal affect, orientation and appearance. Skin: Grossly normal HEENT: Without gross lesions.  No cervical or supraclavicular adenopathy. Thyroid normal.  Lungs:  Clear without wheezing, rales or rhonchi Cardiac: RR, without RMG Abdominal:  Soft, nontender, without masses, guarding, rebound, organomegaly or hernia Breasts:  Examined lying and sitting without masses, retractions, discharge or axillary adenopathy. Pelvic:  Ext/BUS/vagina with mild atrophic changes. Pap smear of cuff done  Adnexa  Without masses or tenderness    Anus and perineum  Normal   Rectovaginal  Normal sphincter tone without palpated masses or tenderness.    Assessment/Plan:  54 y.o. G85P2002 female for annual exam.   1. Postmenopausal/mild atrophic changes. Status post TVH for DUB 2001. Having some hot flash/night sweats. No vaginal dryness or dyspareunia.  Overall tolerable to the patient and she does not want treatment. Call if worsens and wants to rediscuss ERT. 2. Osteopenia. DEXA 08/2013 T score -2.1 FRAX 6.9%/1%. Without significant change from prior DEXA. Supplementing calcium and vitamin D. Check vitamin D level today. 3. Mammography 06/2014. Continue with annual mammography. SBE monthly reviewed. 4. Pap smear 2012.  Pap smear vaginal cuff today.  No history of significant abnormal Pap smears. Status post hysterectomy for benign indications.   Options to stop screening versus less frequent screening intervals reviewed. Will readdress on an annual basis. 5. Colonoscopy 2-3 years ago. Repeat at the their recommended interval. 6. Health maintenance. No routine blood work done and she reports this done at her primary physician's office. Follow up 1 year, sooner as needed.   Anastasio Auerbach MD, 8:38 AM 08/05/2014

## 2014-08-05 NOTE — Addendum Note (Signed)
Addended by: Nelva Nay on: 08/05/2014 08:43 AM   Modules accepted: Orders, SmartSet

## 2014-08-06 LAB — URINALYSIS W MICROSCOPIC + REFLEX CULTURE
Bacteria, UA: NONE SEEN
Bilirubin Urine: NEGATIVE
Casts: NONE SEEN
Glucose, UA: NEGATIVE mg/dL
Hgb urine dipstick: NEGATIVE
Ketones, ur: NEGATIVE mg/dL
Leukocytes, UA: NEGATIVE
Nitrite: NEGATIVE
Protein, ur: NEGATIVE mg/dL
Specific Gravity, Urine: 1.021 (ref 1.005–1.030)
Squamous Epithelial / HPF: NONE SEEN
Urobilinogen, UA: 0.2 mg/dL (ref 0.0–1.0)
pH: 6 (ref 5.0–8.0)

## 2014-08-06 LAB — CYTOLOGY - PAP

## 2014-08-06 LAB — VITAMIN D 25 HYDROXY (VIT D DEFICIENCY, FRACTURES): Vit D, 25-Hydroxy: 39 ng/mL (ref 30–100)

## 2015-05-03 ENCOUNTER — Other Ambulatory Visit: Payer: Self-pay

## 2015-05-03 DIAGNOSIS — Z1231 Encounter for screening mammogram for malignant neoplasm of breast: Secondary | ICD-10-CM

## 2015-07-01 ENCOUNTER — Ambulatory Visit (HOSPITAL_COMMUNITY)
Admission: RE | Admit: 2015-07-01 | Discharge: 2015-07-01 | Disposition: A | Discharge status: Home / Self Care | Payer: BLUE CROSS/BLUE SHIELD | Source: Ambulatory Visit | Attending: Family Medicine | Admitting: Family Medicine

## 2015-07-01 ENCOUNTER — Other Ambulatory Visit (HOSPITAL_COMMUNITY): Payer: Self-pay | Admitting: Family Medicine

## 2015-07-01 DIAGNOSIS — S022XXA Fracture of nasal bones, initial encounter for closed fracture: Secondary | ICD-10-CM | POA: Insufficient documentation

## 2015-07-01 DIAGNOSIS — T148 Other injury of unspecified body region: Secondary | ICD-10-CM | POA: Diagnosis present

## 2015-07-01 DIAGNOSIS — T148XXA Other injury of unspecified body region, initial encounter: Secondary | ICD-10-CM

## 2015-07-01 DIAGNOSIS — W1830XA Fall on same level, unspecified, initial encounter: Secondary | ICD-10-CM | POA: Diagnosis not present

## 2015-07-27 ENCOUNTER — Ambulatory Visit
Admission: RE | Admit: 2015-07-27 | Discharge: 2015-07-27 | Disposition: A | Discharge status: Home / Self Care | Payer: BLUE CROSS/BLUE SHIELD | Source: Ambulatory Visit

## 2015-07-27 DIAGNOSIS — Z1231 Encounter for screening mammogram for malignant neoplasm of breast: Secondary | ICD-10-CM

## 2015-09-22 ENCOUNTER — Encounter: Payer: Self-pay | Admitting: Gynecology

## 2015-09-22 ENCOUNTER — Ambulatory Visit (INDEPENDENT_AMBULATORY_CARE_PROVIDER_SITE_OTHER): Payer: BLUE CROSS/BLUE SHIELD | Admitting: Gynecology

## 2015-09-22 VITALS — BP 120/76 | Ht 63.0 in | Wt 117.0 lb

## 2015-09-22 DIAGNOSIS — N952 Postmenopausal atrophic vaginitis: Secondary | ICD-10-CM | POA: Diagnosis not present

## 2015-09-22 DIAGNOSIS — Z01419 Encounter for gynecological examination (general) (routine) without abnormal findings: Secondary | ICD-10-CM | POA: Diagnosis not present

## 2015-09-22 DIAGNOSIS — M8580 Other specified disorders of bone density and structure, unspecified site: Secondary | ICD-10-CM | POA: Diagnosis not present

## 2015-09-22 NOTE — Progress Notes (Signed)
WAVE POCOCK [birth year] [id number]        55 y.o.  G2P2002  for annual exam. Doing well  Past medical history,surgical history, problem list, medications, allergies, family history and social history were all reviewed and documented as reviewed in the EPIC chart.  ROS:  Performed with pertinent positives and negatives included in the history, assessment and plan.   Additional significant findings :  none   Exam: [name] assistant Filed Vitals:   09/22/15 0948  BP: 120/76  Height: 5\' 3"  (1.6 m)  Weight: 117 lb (53.071 kg)   General appearance:  Normal affect, orientation and appearance. Skin: Grossly normal HEENT: Without gross lesions.  No cervical or supraclavicular adenopathy. Thyroid normal.  Lungs:  Clear without wheezing, rales or rhonchi Cardiac: RR, without RMG Abdominal:  Soft, nontender, without masses, guarding, rebound, organomegaly or hernia Breasts:  Examined lying and sitting without masses, retractions, discharge or axillary adenopathy. Pelvic:  Ext/BUS/vagina with atrophic changes  Adnexa without masses or tenderness    Anus and perineum normal   Rectovaginal normal sphincter tone without palpated masses or tenderness.    Assessment/Plan:  55 y.o. [id number] female for annual exam.   1. Postmenopausal/atrophic changes.  Status post TVH for DUB 2001. No significant hot flushes, night sweats, vaginal dryness. Continue monitor report any issues. 2. Osteopenia. DEXA 08/2013 T score -2.1 FRAX 6.9%/1%. Without significant change from prior DEXA. Check baseline DEXA now. Check vitamin D level. 3. Mammography 07/2015. Continue with annual mammography when due. SBE monthly reviewed. 4. Pap smear 2016 normal. No Pap smear done today. No history of significant abnormal Pap smears. Options to stop screening versus less frequent screening intervals reviewed her current screening guidelines. Will readdress annual basis. 5. Colonoscopy 3-4 years ago. Repeat at their  recommended interval. 6. Health maintenance. No routine blood work done as patient has this done at her primary physician's office. Follow up 1 year, sooner as needed.   Anastasio Auerbach MD, 10:14 AM 09/22/2015

## 2015-09-22 NOTE — Patient Instructions (Signed)
Follow up for bone density as scheduled.  You may obtain a copy of any labs that were done today by logging onto MyChart as outlined in the instructions provided with your AVS (after visit summary). The office will not call with normal lab results but certainly if there are any significant abnormalities then we will contact you.   Health Maintenance Adopting a healthy lifestyle and getting preventive care can go a long way to promote health and wellness. Talk with your health care provider about what schedule of regular examinations is right for you. This is a good chance for you to check in with your provider about disease prevention and staying healthy. In between checkups, there are plenty of things you can do on your own. Experts have done a lot of research about which lifestyle changes and preventive measures are most likely to keep you healthy. Ask your health care provider for more information. WEIGHT AND DIET  Eat a healthy diet  Be sure to include plenty of vegetables, fruits, low-fat dairy products, and lean protein.  Do not eat a lot of foods high in solid fats, added sugars, or salt.  Get regular exercise. This is one of the most important things you can do for your health.  Most adults should exercise for at least 150 minutes each week. The exercise should increase your heart rate and make you sweat (moderate-intensity exercise).  Most adults should also do strengthening exercises at least twice a week. This is in addition to the moderate-intensity exercise.  Maintain a healthy weight  Body mass index (BMI) is a measurement that can be used to identify possible weight problems. It estimates body fat based on height and weight. Your health care provider can help determine your BMI and help you achieve or maintain a healthy weight.  For females 58 years of age and older:   A BMI below 18.5 is considered underweight.  A BMI of 18.5 to 24.9 is normal.  A BMI of 25 to 29.9 is  considered overweight.  A BMI of 30 and above is considered obese.  Watch levels of cholesterol and blood lipids  You should start having your blood tested for lipids and cholesterol at 57 years of age, then have this test every 5 years.  You may need to have your cholesterol levels checked more often if:  Your lipid or cholesterol levels are high.  You are older than 57 years of age.  You are at high risk for heart disease.  CANCER SCREENING   Lung Cancer  Lung cancer screening is recommended for adults 19-60 years old who are at high risk for lung cancer because of a history of smoking.  A yearly low-dose CT scan of the lungs is recommended for people who:  Currently smoke.  Have quit within the past 15 years.  Have at least a 30-pack-year history of smoking. A pack year is smoking an average of one pack of cigarettes a day for 1 year.  Yearly screening should continue until it has been 15 years since you quit.  Yearly screening should stop if you develop a health problem that would prevent you from having lung cancer treatment.  Breast Cancer  Practice breast self-awareness. This means understanding how your breasts normally appear and feel.  It also means doing regular breast self-exams. Let your health care provider know about any changes, no matter how small.  If you are in your 20s or 30s, you should have a clinical breast exam (  CBE) by a health care provider every 1-3 years as part of a regular health exam.  If you are 56 or older, have a CBE every year. Also consider having a breast X-ray (mammogram) every year.  If you have a family history of breast cancer, talk to your health care provider about genetic screening.  If you are at high risk for breast cancer, talk to your health care provider about having an MRI and a mammogram every year.  Breast cancer gene (BRCA) assessment is recommended for women who have family members with BRCA-related cancers.  BRCA-related cancers include:  Breast.  Ovarian.  Tubal.  Peritoneal cancers.  Results of the assessment will determine the need for genetic counseling and BRCA1 and BRCA2 testing. Cervical Cancer Routine pelvic examinations to screen for cervical cancer are no longer recommended for nonpregnant women who are considered low risk for cancer of the pelvic organs (ovaries, uterus, and vagina) and who do not have symptoms. A pelvic examination may be necessary if you have symptoms including those associated with pelvic infections. Ask your health care provider if a screening pelvic exam is right for you.   The Pap test is the screening test for cervical cancer for women who are considered at risk.  If you had a hysterectomy for a problem that was not cancer or a condition that could lead to cancer, then you no longer need Pap tests.  If you are older than 65 years, and you have had normal Pap tests for the past 10 years, you no longer need to have Pap tests.  If you have had past treatment for cervical cancer or a condition that could lead to cancer, you need Pap tests and screening for cancer for at least 20 years after your treatment.  If you no longer get a Pap test, assess your risk factors if they change (such as having a new sexual partner). This can affect whether you should start being screened again.  Some women have medical problems that increase their chance of getting cervical cancer. If this is the case for you, your health care provider may recommend more frequent screening and Pap tests.  The human papillomavirus (HPV) test is another test that may be used for cervical cancer screening. The HPV test looks for the virus that can cause cell changes in the cervix. The cells collected during the Pap test can be tested for HPV.  The HPV test can be used to screen women 39 years of age and older. Getting tested for HPV can extend the interval between normal Pap tests from three to  five years.  An HPV test also should be used to screen women of any age who have unclear Pap test results.  After 57 years of age, women should have HPV testing as often as Pap tests.  Colorectal Cancer  This type of cancer can be detected and often prevented.  Routine colorectal cancer screening usually begins at 57 years of age and continues through 57 years of age.  Your health care provider may recommend screening at an earlier age if you have risk factors for colon cancer.  Your health care provider may also recommend using home test kits to check for hidden blood in the stool.  A small camera at the end of a tube can be used to examine your colon directly (sigmoidoscopy or colonoscopy). This is done to check for the earliest forms of colorectal cancer.  Routine screening usually begins at age 77.  Direct examination of the colon should be repeated every 5-10 years through 57 years of age. However, you may need to be screened more often if early forms of precancerous polyps or small growths are found. Skin Cancer  Check your skin from head to toe regularly.  Tell your health care provider about any new moles or changes in moles, especially if there is a change in a mole's shape or color.  Also tell your health care provider if you have a mole that is larger than the size of a pencil eraser.  Always use sunscreen. Apply sunscreen liberally and repeatedly throughout the day.  Protect yourself by wearing long sleeves, pants, a wide-brimmed hat, and sunglasses whenever you are outside. HEART DISEASE, DIABETES, AND HIGH BLOOD PRESSURE   Have your blood pressure checked at least every 1-2 years. High blood pressure causes heart disease and increases the risk of stroke.  If you are between 76 years and 55 years old, ask your health care provider if you should take aspirin to prevent strokes.  Have regular diabetes screenings. This involves taking a blood sample to check your  fasting blood sugar level.  If you are at a normal weight and have a low risk for diabetes, have this test once every three years after 57 years of age.  If you are overweight and have a high risk for diabetes, consider being tested at a younger age or more often. PREVENTING INFECTION  Hepatitis B  If you have a higher risk for hepatitis B, you should be screened for this virus. You are considered at high risk for hepatitis B if:  You were born in a country where hepatitis B is common. Ask your health care provider which countries are considered high risk.  Your parents were born in a high-risk country, and you have not been immunized against hepatitis B (hepatitis B vaccine).  You have HIV or AIDS.  You use needles to inject street drugs.  You live with someone who has hepatitis B.  You have had sex with someone who has hepatitis B.  You get hemodialysis treatment.  You take certain medicines for conditions, including cancer, organ transplantation, and autoimmune conditions. Hepatitis C  Blood testing is recommended for:  Everyone born from [birth year] through 1967.  Anyone with known risk factors for hepatitis C. Sexually transmitted infections (STIs)  You should be screened for sexually transmitted infections (STIs) including gonorrhea and chlamydia if:  You are sexually active and are younger than 57 years of age.  You are older than 57 years of age and your health care provider tells you that you are at risk for this type of infection.  Your sexual activity has changed since you were last screened and you are at an increased risk for chlamydia or gonorrhea. Ask your health care provider if you are at risk.  If you do not have HIV, but are at risk, it may be recommended that you take a prescription medicine daily to prevent HIV infection. This is called pre-exposure prophylaxis (PrEP). You are considered at risk if:  You are sexually active and do not regularly use condoms or  know the HIV status of your partner(s).  You take drugs by injection.  You are sexually active with a partner who has HIV. Talk with your health care provider about whether you are at high risk of being infected with HIV. If you choose to begin PrEP, you should first be tested for HIV. You should then be  tested every 3 months for as long as you are taking PrEP.  PREGNANCY   If you are premenopausal and you may become pregnant, ask your health care provider about preconception counseling.  If you may become pregnant, take 400 to 800 micrograms (mcg) of folic acid every day.  If you want to prevent pregnancy, talk to your health care provider about birth control (contraception). OSTEOPOROSIS AND MENOPAUSE   Osteoporosis is a disease in which the bones lose minerals and strength with aging. This can result in serious bone fractures. Your risk for osteoporosis can be identified using a bone density scan.  If you are 48 years of age or older, or if you are at risk for osteoporosis and fractures, ask your health care provider if you should be screened.  Ask your health care provider whether you should take a calcium or vitamin D supplement to lower your risk for osteoporosis.  Menopause may have certain physical symptoms and risks.  Hormone replacement therapy may reduce some of these symptoms and risks. Talk to your health care provider about whether hormone replacement therapy is right for you.  HOME CARE INSTRUCTIONS   Schedule regular health, dental, and eye exams.  Stay current with your immunizations.   Do not use any tobacco products including cigarettes, chewing tobacco, or electronic cigarettes.  If you are pregnant, do not drink alcohol.  If you are breastfeeding, limit how much and how often you drink alcohol.  Limit alcohol intake to no more than 1 drink per day for nonpregnant women. One drink equals 12 ounces of beer, 5 ounces of wine, or 1 ounces of hard liquor.  Do  not use street drugs.  Do not share needles.  Ask your health care provider for help if you need support or information about quitting drugs.  Tell your health care provider if you often feel depressed.  Tell your health care provider if you have ever been abused or do not feel safe at home. Document Released: 01/30/2011 Document Revised: 12/01/2013 Document Reviewed: 06/18/2013 [facility] Patient Information 2015 [place], [place]. This information is not intended to replace advice given to you by your health care provider. Make sure you discuss any questions you have with your health care provider.

## 2015-09-23 LAB — URINALYSIS W MICROSCOPIC + REFLEX CULTURE
BACTERIA UA: NONE SEEN [HPF]
BILIRUBIN URINE: NEGATIVE
Casts: NONE SEEN [LPF]
GLUCOSE, UA: NEGATIVE
Hgb urine dipstick: NEGATIVE
KETONES UR: NEGATIVE
Leukocytes, UA: NEGATIVE
Nitrite: NEGATIVE
PROTEIN: NEGATIVE
SQUAMOUS EPITHELIAL / LPF: NONE SEEN [HPF] (ref <=5)
Specific Gravity, Urine: 1.021 (ref 1.001–1.035)
WBC UA: NONE SEEN WBC/HPF (ref <=5)
Yeast: NONE SEEN [HPF]
pH: 6 (ref 5.0–8.0)

## 2015-09-23 LAB — VITAMIN D 25 HYDROXY (VIT D DEFICIENCY, FRACTURES): Vit D, 25-Hydroxy: 47 ng/mL (ref 30–100)

## 2015-09-24 LAB — URINE CULTURE
COLONY COUNT: NO GROWTH
ORGANISM ID, BACTERIA: NO GROWTH

## 2015-10-30 DIAGNOSIS — M8580 Other specified disorders of bone density and structure, unspecified site: Secondary | ICD-10-CM

## 2015-10-30 HISTORY — DX: Other specified disorders of bone density and structure, unspecified site: M85.80

## 2015-11-02 DIAGNOSIS — D485 Neoplasm of uncertain behavior of skin: Secondary | ICD-10-CM | POA: Diagnosis not present

## 2015-11-02 DIAGNOSIS — D225 Melanocytic nevi of trunk: Secondary | ICD-10-CM | POA: Diagnosis not present

## 2015-11-02 DIAGNOSIS — D2261 Melanocytic nevi of right upper limb, including shoulder: Secondary | ICD-10-CM | POA: Diagnosis not present

## 2015-11-02 DIAGNOSIS — L821 Other seborrheic keratosis: Secondary | ICD-10-CM | POA: Diagnosis not present

## 2015-11-02 DIAGNOSIS — D0359 Melanoma in situ of other part of trunk: Secondary | ICD-10-CM | POA: Diagnosis not present

## 2015-11-02 DIAGNOSIS — D2272 Melanocytic nevi of left lower limb, including hip: Secondary | ICD-10-CM | POA: Diagnosis not present

## 2015-11-18 DIAGNOSIS — D0359 Melanoma in situ of other part of trunk: Secondary | ICD-10-CM | POA: Diagnosis not present

## 2015-11-22 ENCOUNTER — Ambulatory Visit (INDEPENDENT_AMBULATORY_CARE_PROVIDER_SITE_OTHER): Payer: BLUE CROSS/BLUE SHIELD

## 2015-11-22 DIAGNOSIS — Z13820 Encounter for screening for osteoporosis: Secondary | ICD-10-CM

## 2015-11-22 DIAGNOSIS — M899 Disorder of bone, unspecified: Secondary | ICD-10-CM | POA: Diagnosis not present

## 2015-11-22 DIAGNOSIS — M8580 Other specified disorders of bone density and structure, unspecified site: Secondary | ICD-10-CM

## 2015-11-23 ENCOUNTER — Encounter: Payer: Self-pay | Admitting: Gynecology

## 2015-11-23 ENCOUNTER — Other Ambulatory Visit: Payer: Self-pay | Admitting: Gynecology

## 2015-11-23 DIAGNOSIS — Z13820 Encounter for screening for osteoporosis: Secondary | ICD-10-CM

## 2015-11-23 DIAGNOSIS — M8580 Other specified disorders of bone density and structure, unspecified site: Secondary | ICD-10-CM

## 2015-12-08 DIAGNOSIS — D485 Neoplasm of uncertain behavior of skin: Secondary | ICD-10-CM | POA: Diagnosis not present

## 2015-12-08 DIAGNOSIS — L089 Local infection of the skin and subcutaneous tissue, unspecified: Secondary | ICD-10-CM | POA: Diagnosis not present

## 2016-01-17 DIAGNOSIS — B070 Plantar wart: Secondary | ICD-10-CM | POA: Diagnosis not present

## 2016-01-17 DIAGNOSIS — Z8781 Personal history of (healed) traumatic fracture: Secondary | ICD-10-CM | POA: Diagnosis not present

## 2016-01-27 DIAGNOSIS — H578 Other specified disorders of eye and adnexa: Secondary | ICD-10-CM | POA: Diagnosis not present

## 2016-02-03 DIAGNOSIS — Z800 Family history of malignant neoplasm of digestive organs: Secondary | ICD-10-CM | POA: Diagnosis not present

## 2016-02-03 DIAGNOSIS — Z1211 Encounter for screening for malignant neoplasm of colon: Secondary | ICD-10-CM | POA: Diagnosis not present

## 2016-02-03 DIAGNOSIS — Z8371 Family history of colonic polyps: Secondary | ICD-10-CM | POA: Diagnosis not present

## 2016-02-09 DIAGNOSIS — D225 Melanocytic nevi of trunk: Secondary | ICD-10-CM | POA: Diagnosis not present

## 2016-02-09 DIAGNOSIS — Z85820 Personal history of malignant melanoma of skin: Secondary | ICD-10-CM | POA: Diagnosis not present

## 2016-02-09 DIAGNOSIS — D2272 Melanocytic nevi of left lower limb, including hip: Secondary | ICD-10-CM | POA: Diagnosis not present

## 2016-02-09 DIAGNOSIS — L821 Other seborrheic keratosis: Secondary | ICD-10-CM | POA: Diagnosis not present

## 2016-03-14 DIAGNOSIS — Z23 Encounter for immunization: Secondary | ICD-10-CM | POA: Diagnosis not present

## 2016-03-14 DIAGNOSIS — E785 Hyperlipidemia, unspecified: Secondary | ICD-10-CM | POA: Diagnosis not present

## 2016-04-11 DIAGNOSIS — R0982 Postnasal drip: Secondary | ICD-10-CM | POA: Diagnosis not present

## 2016-04-11 DIAGNOSIS — J019 Acute sinusitis, unspecified: Secondary | ICD-10-CM | POA: Diagnosis not present

## 2016-06-14 DIAGNOSIS — D2272 Melanocytic nevi of left lower limb, including hip: Secondary | ICD-10-CM | POA: Diagnosis not present

## 2016-06-14 DIAGNOSIS — L570 Actinic keratosis: Secondary | ICD-10-CM | POA: Diagnosis not present

## 2016-06-14 DIAGNOSIS — D225 Melanocytic nevi of trunk: Secondary | ICD-10-CM | POA: Diagnosis not present

## 2016-06-14 DIAGNOSIS — Z85820 Personal history of malignant melanoma of skin: Secondary | ICD-10-CM | POA: Diagnosis not present

## 2016-06-14 DIAGNOSIS — L821 Other seborrheic keratosis: Secondary | ICD-10-CM | POA: Diagnosis not present

## 2016-08-15 DIAGNOSIS — J069 Acute upper respiratory infection, unspecified: Secondary | ICD-10-CM | POA: Diagnosis not present

## 2016-09-03 DIAGNOSIS — R091 Pleurisy: Secondary | ICD-10-CM | POA: Diagnosis not present

## 2016-09-03 DIAGNOSIS — R079 Chest pain, unspecified: Secondary | ICD-10-CM | POA: Diagnosis not present

## 2016-09-04 ENCOUNTER — Other Ambulatory Visit: Payer: Self-pay | Admitting: Gynecology

## 2016-09-04 DIAGNOSIS — Z1231 Encounter for screening mammogram for malignant neoplasm of breast: Secondary | ICD-10-CM

## 2016-09-08 DIAGNOSIS — R05 Cough: Secondary | ICD-10-CM | POA: Diagnosis not present

## 2016-09-08 DIAGNOSIS — M940 Chondrocostal junction syndrome [Tietze]: Secondary | ICD-10-CM | POA: Diagnosis not present

## 2016-09-13 ENCOUNTER — Ambulatory Visit: Payer: BLUE CROSS/BLUE SHIELD

## 2016-09-15 DIAGNOSIS — B001 Herpesviral vesicular dermatitis: Secondary | ICD-10-CM | POA: Diagnosis not present

## 2016-09-15 DIAGNOSIS — E785 Hyperlipidemia, unspecified: Secondary | ICD-10-CM | POA: Diagnosis not present

## 2016-09-15 DIAGNOSIS — R0789 Other chest pain: Secondary | ICD-10-CM | POA: Diagnosis not present

## 2016-09-22 ENCOUNTER — Encounter: Payer: BLUE CROSS/BLUE SHIELD | Admitting: Gynecology

## 2016-09-25 ENCOUNTER — Encounter: Payer: Self-pay | Admitting: Gynecology

## 2016-09-25 ENCOUNTER — Ambulatory Visit (INDEPENDENT_AMBULATORY_CARE_PROVIDER_SITE_OTHER): Payer: BLUE CROSS/BLUE SHIELD | Admitting: Gynecology

## 2016-09-25 VITALS — BP 116/70 | Ht 63.0 in | Wt 116.0 lb

## 2016-09-25 DIAGNOSIS — N952 Postmenopausal atrophic vaginitis: Secondary | ICD-10-CM | POA: Diagnosis not present

## 2016-09-25 DIAGNOSIS — Z01411 Encounter for gynecological examination (general) (routine) with abnormal findings: Secondary | ICD-10-CM | POA: Diagnosis not present

## 2016-09-25 DIAGNOSIS — M8580 Other specified disorders of bone density and structure, unspecified site: Secondary | ICD-10-CM

## 2016-09-25 NOTE — Progress Notes (Signed)
    Judy Mejia 1960/12/16 [id number]        56 y.o.  G2P2002 for annual exam.    Past medical history,surgical history, problem list, medications, allergies, family history and social history were all reviewed and documented as reviewed in the EPIC chart.  ROS:  Performed with pertinent positives and negatives included in the history, assessment and plan.   Additional significant findings :  None   Exam: [name] assistant Vitals:   09/25/16 0919  BP: 116/70  Weight: 116 lb (52.6 kg)  Height: 5\' 3"  (1.6 m)   Body mass index is 20.55 kg/m.  General appearance:  Normal affect, orientation and appearance. Skin: Grossly normal HEENT: Without gross lesions.  No cervical or supraclavicular adenopathy. Thyroid normal.  Lungs:  Clear without wheezing, rales or rhonchi Cardiac: RR, without RMG Abdominal:  Soft, nontender, without masses, guarding, rebound, organomegaly or hernia Breasts:  Examined lying and sitting without masses, retractions, discharge or axillary adenopathy. Pelvic:  Ext, BUS, Vagina: With atrophic changes  Adnexa: Without masses or tenderness    Anus and perineum: Normal   Rectovaginal: Normal sphincter tone without palpated masses or tenderness.    Assessment/Plan:  56 y.o. G67P2002 female for annual exam.   1. Postmenopausal/atrophic genital changes. Status post TVH for DUB 2001. No significant hot flushes, night sweats, vaginal dryness. 2. Osteopenia. DEXA 10/2015 T score -2.1 FRAX 7.8%/1.1%. Increased calcium vitamin D reviewed. Plan repeat DEXA at 2 year interval. 3. Mammography scheduled next week. SBE monthly reviewed. 4. Colonoscopy 2017. Repeat at their recommended interval. 5. Pap smear 07/2014. No Pap smear done today. No history of significant abnormal Pap smears. Options to stop screening per current screening guidelines based on hysterectomy history reviewed. Will readdress on annual basis. 6. Health maintenance. No routine lab work done as  patient reports this done elsewhere. Follow up 1 year, sooner as needed.   Anastasio Auerbach MD, 9:47 AM 09/25/2016

## 2016-09-25 NOTE — Patient Instructions (Signed)

## 2016-10-04 ENCOUNTER — Ambulatory Visit
Admission: RE | Admit: 2016-10-04 | Discharge: 2016-10-04 | Disposition: A | Discharge status: Home / Self Care | Payer: BLUE CROSS/BLUE SHIELD | Source: Ambulatory Visit | Attending: Gynecology | Admitting: Gynecology

## 2016-10-04 DIAGNOSIS — Z1231 Encounter for screening mammogram for malignant neoplasm of breast: Secondary | ICD-10-CM

## 2016-10-18 DIAGNOSIS — E785 Hyperlipidemia, unspecified: Secondary | ICD-10-CM | POA: Diagnosis not present

## 2016-11-06 DIAGNOSIS — L821 Other seborrheic keratosis: Secondary | ICD-10-CM | POA: Diagnosis not present

## 2016-11-06 DIAGNOSIS — D2272 Melanocytic nevi of left lower limb, including hip: Secondary | ICD-10-CM | POA: Diagnosis not present

## 2016-11-06 DIAGNOSIS — L570 Actinic keratosis: Secondary | ICD-10-CM | POA: Diagnosis not present

## 2016-11-06 DIAGNOSIS — L300 Nummular dermatitis: Secondary | ICD-10-CM | POA: Diagnosis not present

## 2016-11-06 DIAGNOSIS — D225 Melanocytic nevi of trunk: Secondary | ICD-10-CM | POA: Diagnosis not present

## 2017-05-20 DIAGNOSIS — Z23 Encounter for immunization: Secondary | ICD-10-CM | POA: Diagnosis not present

## 2017-05-22 DIAGNOSIS — L821 Other seborrheic keratosis: Secondary | ICD-10-CM | POA: Diagnosis not present

## 2017-05-22 DIAGNOSIS — D1801 Hemangioma of skin and subcutaneous tissue: Secondary | ICD-10-CM | POA: Diagnosis not present

## 2017-05-22 DIAGNOSIS — D225 Melanocytic nevi of trunk: Secondary | ICD-10-CM | POA: Diagnosis not present

## 2017-05-22 DIAGNOSIS — L281 Prurigo nodularis: Secondary | ICD-10-CM | POA: Diagnosis not present

## 2017-08-20 DIAGNOSIS — B001 Herpesviral vesicular dermatitis: Secondary | ICD-10-CM | POA: Diagnosis not present

## 2017-08-20 DIAGNOSIS — E785 Hyperlipidemia, unspecified: Secondary | ICD-10-CM | POA: Diagnosis not present

## 2017-08-31 ENCOUNTER — Other Ambulatory Visit: Payer: Self-pay | Admitting: Gynecology

## 2017-08-31 DIAGNOSIS — Z1231 Encounter for screening mammogram for malignant neoplasm of breast: Secondary | ICD-10-CM

## 2017-09-26 ENCOUNTER — Ambulatory Visit: Payer: BLUE CROSS/BLUE SHIELD | Admitting: Gynecology

## 2017-09-26 ENCOUNTER — Encounter: Payer: Self-pay | Admitting: Gynecology

## 2017-09-26 VITALS — BP 120/78 | Ht 63.0 in | Wt 123.0 lb

## 2017-09-26 DIAGNOSIS — M8588 Other specified disorders of bone density and structure, other site: Secondary | ICD-10-CM

## 2017-09-26 DIAGNOSIS — Z01419 Encounter for gynecological examination (general) (routine) without abnormal findings: Secondary | ICD-10-CM | POA: Diagnosis not present

## 2017-09-26 DIAGNOSIS — Z01411 Encounter for gynecological examination (general) (routine) with abnormal findings: Secondary | ICD-10-CM | POA: Diagnosis not present

## 2017-09-26 DIAGNOSIS — N952 Postmenopausal atrophic vaginitis: Secondary | ICD-10-CM

## 2017-09-26 NOTE — Patient Instructions (Signed)
Follow-up for bone density as scheduled  Follow-up for annual exam in 1 year   

## 2017-09-26 NOTE — Addendum Note (Signed)
Addended by: Nelva Nay on: 09/26/2017 09:18 AM   Modules accepted: Orders

## 2017-09-26 NOTE — Progress Notes (Signed)
    Judy Mejia April 18, 1961 [id number]        57 y.o.  [id number] for annual gynecologic exam.  Doing well without gynecologic complaints  Past medical history,surgical history, problem list, medications, allergies, family history and social history were all reviewed and documented as reviewed in the EPIC chart.  ROS:  Performed with pertinent positives and negatives included in the history, assessment and plan.   Additional significant findings : None   Exam: [name] assistant Vitals:   09/26/17 0810  BP: 120/78  Weight: 123 lb (55.8 kg)  Height: 5\' 3"  (1.6 m)   Body mass index is 21.79 kg/m.  General appearance:  Normal affect, orientation and appearance. Skin: Grossly normal HEENT: Without gross lesions.  No cervical or supraclavicular adenopathy. Thyroid normal.  Lungs:  Clear without wheezing, rales or rhonchi Cardiac: RR, without RMG Abdominal:  Soft, nontender, without masses, guarding, rebound, organomegaly or hernia Breasts:  Examined lying and sitting without masses, retractions, discharge or axillary adenopathy. Pelvic:  Ext, BUS, Vagina: With atrophic changes.  Pap smear of vaginal cuff done  Adnexa: Without masses or tenderness    Anus and perineum: Normal   Rectovaginal: Normal sphincter tone without palpated masses or tenderness.    Assessment/Plan:  57 y.o. G34P2002 female for annual gynecologic exam status post TVH for DU B 2001.   1. Postmenopausal/atrophic genital changes.  No significant hot flushes, night sweats or vaginal dryness. 2. Osteopenia.  DEXA 10/2015 T score -2.1 out FRAX 7.8% / 1.1%.  Recheck DEXA now after April and patient will schedule. 3. Mammography coming due in March and I reminded her to schedule this.  Breast exam normal today. 4. Colonoscopy 2017.  Repeat at their recommended interval. 5. Pap smear 2016.  Pap smear done today.  Reviewed current screening guidelines and options to stop screening based on hysterectomy history  reviewed.  No history of significant abnormal Pap smears previously.  Patient at this point feels more comfortable with continuing screening. 6. Health maintenance.  Patient reports routine blood work done elsewhere.  Follow-up for DEXA otherwise 1 year, sooner if any issues.   Anastasio Auerbach MD, 8:50 AM 09/26/2017

## 2017-10-01 LAB — PAP IG W/ RFLX HPV ASCU

## 2017-10-08 ENCOUNTER — Ambulatory Visit
Admission: RE | Admit: 2017-10-08 | Discharge: 2017-10-08 | Disposition: A | Discharge status: Home / Self Care | Payer: BLUE CROSS/BLUE SHIELD | Source: Ambulatory Visit | Attending: Gynecology | Admitting: Gynecology

## 2017-10-08 DIAGNOSIS — Z1231 Encounter for screening mammogram for malignant neoplasm of breast: Secondary | ICD-10-CM

## 2017-11-15 DIAGNOSIS — J069 Acute upper respiratory infection, unspecified: Secondary | ICD-10-CM | POA: Diagnosis not present

## 2017-11-15 DIAGNOSIS — R05 Cough: Secondary | ICD-10-CM | POA: Diagnosis not present

## 2017-11-20 DIAGNOSIS — L821 Other seborrheic keratosis: Secondary | ICD-10-CM | POA: Diagnosis not present

## 2017-11-20 DIAGNOSIS — L300 Nummular dermatitis: Secondary | ICD-10-CM | POA: Diagnosis not present

## 2017-11-20 DIAGNOSIS — D225 Melanocytic nevi of trunk: Secondary | ICD-10-CM | POA: Diagnosis not present

## 2017-11-20 DIAGNOSIS — Z85820 Personal history of malignant melanoma of skin: Secondary | ICD-10-CM | POA: Diagnosis not present

## 2018-01-24 ENCOUNTER — Other Ambulatory Visit: Payer: Self-pay | Admitting: Gynecology

## 2018-01-24 ENCOUNTER — Ambulatory Visit (INDEPENDENT_AMBULATORY_CARE_PROVIDER_SITE_OTHER): Payer: BLUE CROSS/BLUE SHIELD

## 2018-01-24 DIAGNOSIS — M8589 Other specified disorders of bone density and structure, multiple sites: Secondary | ICD-10-CM

## 2018-01-24 DIAGNOSIS — Z13820 Encounter for screening for osteoporosis: Secondary | ICD-10-CM

## 2018-01-24 DIAGNOSIS — M8588 Other specified disorders of bone density and structure, other site: Secondary | ICD-10-CM

## 2018-01-28 ENCOUNTER — Encounter: Payer: Self-pay | Admitting: Gynecology

## 2018-04-19 DIAGNOSIS — Z23 Encounter for immunization: Secondary | ICD-10-CM | POA: Diagnosis not present

## 2018-05-02 DIAGNOSIS — Z23 Encounter for immunization: Secondary | ICD-10-CM | POA: Diagnosis not present

## 2018-06-04 DIAGNOSIS — D225 Melanocytic nevi of trunk: Secondary | ICD-10-CM | POA: Diagnosis not present

## 2018-06-04 DIAGNOSIS — Z85820 Personal history of malignant melanoma of skin: Secondary | ICD-10-CM | POA: Diagnosis not present

## 2018-06-04 DIAGNOSIS — D1801 Hemangioma of skin and subcutaneous tissue: Secondary | ICD-10-CM | POA: Diagnosis not present

## 2018-06-04 DIAGNOSIS — L821 Other seborrheic keratosis: Secondary | ICD-10-CM | POA: Diagnosis not present

## 2018-08-19 DIAGNOSIS — Z23 Encounter for immunization: Secondary | ICD-10-CM | POA: Diagnosis not present

## 2018-08-20 DIAGNOSIS — E785 Hyperlipidemia, unspecified: Secondary | ICD-10-CM | POA: Diagnosis not present

## 2018-08-20 DIAGNOSIS — B001 Herpesviral vesicular dermatitis: Secondary | ICD-10-CM | POA: Diagnosis not present

## 2018-08-20 DIAGNOSIS — G479 Sleep disorder, unspecified: Secondary | ICD-10-CM | POA: Diagnosis not present

## 2018-10-03 ENCOUNTER — Other Ambulatory Visit: Payer: Self-pay | Admitting: Gynecology

## 2018-10-03 DIAGNOSIS — Z1231 Encounter for screening mammogram for malignant neoplasm of breast: Secondary | ICD-10-CM

## 2018-10-04 ENCOUNTER — Ambulatory Visit (INDEPENDENT_AMBULATORY_CARE_PROVIDER_SITE_OTHER): Payer: BLUE CROSS/BLUE SHIELD | Admitting: Gynecology

## 2018-10-04 ENCOUNTER — Encounter: Payer: Self-pay | Admitting: Gynecology

## 2018-10-04 VITALS — BP 116/76 | Ht 63.0 in | Wt 122.0 lb

## 2018-10-04 DIAGNOSIS — M8580 Other specified disorders of bone density and structure, unspecified site: Secondary | ICD-10-CM | POA: Diagnosis not present

## 2018-10-04 DIAGNOSIS — N952 Postmenopausal atrophic vaginitis: Secondary | ICD-10-CM | POA: Diagnosis not present

## 2018-10-04 DIAGNOSIS — Z01419 Encounter for gynecological examination (general) (routine) without abnormal findings: Secondary | ICD-10-CM | POA: Diagnosis not present

## 2018-10-04 NOTE — Progress Notes (Signed)
    Judy Mejia 1961/07/15 [id number]        58 y.o.  [id number] for annual gynecologic exam.  Without gynecologic complaints  Past medical history,surgical history, problem list, medications, allergies, family history and social history were all reviewed and documented as reviewed in the EPIC chart.  ROS:  Performed with pertinent positives and negatives included in the history, assessment and plan.   Additional significant findings : None   Exam: [name] assistant Vitals:   10/04/18 0937  BP: 116/76  Weight: 122 lb (55.3 kg)  Height: 5\' 3"  (1.6 m)   Body mass index is 21.61 kg/m.  General appearance:  Normal affect, orientation and appearance. Skin: Grossly normal HEENT: Without gross lesions.  No cervical or supraclavicular adenopathy. Thyroid normal.  Lungs:  Clear without wheezing, rales or rhonchi Cardiac: RR, without RMG Abdominal:  Soft, nontender, without masses, guarding, rebound, organomegaly or hernia Breasts:  Examined lying and sitting without masses, retractions, discharge or axillary adenopathy. Pelvic:  Ext, BUS, Vagina: Normal with atrophic changes  Adnexa: Without masses or tenderness    Anus and perineum: Normal   Rectovaginal: Normal sphincter tone without palpated masses or tenderness.    Assessment/Plan:  58 y.o. G47P2002 female for annual gynecologic exam.  Status post TVH for DUB 2001  1. Postmenopausal.  No significant menopausal symptoms 2. Osteopenia.  DEXA 2019 T score -2.4 FRAX 7% / 1%.  Plan repeat DEXA next year.  Check vitamin D level today.   3. Mammography scheduled end of this month.  Breast exam normal today. 4. Pap smear 2019.  No Pap smear done today.  No history of significant abnormal Pap smears.  Options to stop screening per current screening guidelines based on hysterectomy history reviewed. 5. Colonoscopy 2017.  Repeat at their recommended interval. 6. Health maintenance.  No routine lab work done as patient reports this done  elsewhere.  Follow-up 1 year, sooner as needed.   Anastasio Auerbach MD, 9:57 AM 10/04/2018

## 2018-10-04 NOTE — Patient Instructions (Signed)
Follow-up in 1 year for annual exam, sooner if any issues. 

## 2018-10-05 LAB — VITAMIN D 25 HYDROXY (VIT D DEFICIENCY, FRACTURES): Vit D, 25-Hydroxy: 73 ng/mL (ref 30–100)

## 2018-10-17 ENCOUNTER — Encounter: Payer: Self-pay | Admitting: Gynecology

## 2019-01-23 ENCOUNTER — Ambulatory Visit
Admission: RE | Admit: 2019-01-23 | Discharge: 2019-01-23 | Disposition: A | Discharge status: Home / Self Care | Payer: BC Managed Care – PPO | Source: Ambulatory Visit | Attending: Gynecology | Admitting: Gynecology

## 2019-01-23 ENCOUNTER — Other Ambulatory Visit: Payer: Self-pay

## 2019-01-23 DIAGNOSIS — Z1231 Encounter for screening mammogram for malignant neoplasm of breast: Secondary | ICD-10-CM | POA: Diagnosis not present

## 2019-04-08 DIAGNOSIS — L821 Other seborrheic keratosis: Secondary | ICD-10-CM | POA: Diagnosis not present

## 2019-04-08 DIAGNOSIS — L700 Acne vulgaris: Secondary | ICD-10-CM | POA: Diagnosis not present

## 2019-04-08 DIAGNOSIS — L400 Psoriasis vulgaris: Secondary | ICD-10-CM | POA: Diagnosis not present

## 2019-04-08 DIAGNOSIS — D225 Melanocytic nevi of trunk: Secondary | ICD-10-CM | POA: Diagnosis not present

## 2019-04-08 DIAGNOSIS — B070 Plantar wart: Secondary | ICD-10-CM | POA: Diagnosis not present

## 2019-04-25 DIAGNOSIS — Z23 Encounter for immunization: Secondary | ICD-10-CM | POA: Diagnosis not present

## 2019-04-29 ENCOUNTER — Encounter: Payer: Self-pay | Admitting: Gynecology

## 2019-05-09 DIAGNOSIS — Z20828 Contact with and (suspected) exposure to other viral communicable diseases: Secondary | ICD-10-CM | POA: Diagnosis not present

## 2019-07-22 ENCOUNTER — Ambulatory Visit: Payer: BC Managed Care – PPO | Attending: Internal Medicine

## 2019-07-22 DIAGNOSIS — Z20822 Contact with and (suspected) exposure to covid-19: Secondary | ICD-10-CM

## 2019-07-22 DIAGNOSIS — Z20828 Contact with and (suspected) exposure to other viral communicable diseases: Secondary | ICD-10-CM | POA: Diagnosis not present

## 2019-07-24 LAB — NOVEL CORONAVIRUS, NAA: SARS-CoV-2, NAA: NOT DETECTED

## 2019-08-05 ENCOUNTER — Ambulatory Visit: Payer: BC Managed Care – PPO | Attending: Internal Medicine

## 2019-08-05 DIAGNOSIS — Z20822 Contact with and (suspected) exposure to covid-19: Secondary | ICD-10-CM

## 2019-08-07 LAB — NOVEL CORONAVIRUS, NAA: SARS-CoV-2, NAA: NOT DETECTED

## 2019-08-20 DIAGNOSIS — E785 Hyperlipidemia, unspecified: Secondary | ICD-10-CM | POA: Diagnosis not present

## 2019-08-20 DIAGNOSIS — M1990 Unspecified osteoarthritis, unspecified site: Secondary | ICD-10-CM | POA: Diagnosis not present

## 2019-08-20 DIAGNOSIS — B001 Herpesviral vesicular dermatitis: Secondary | ICD-10-CM | POA: Diagnosis not present

## 2019-08-27 DIAGNOSIS — D485 Neoplasm of uncertain behavior of skin: Secondary | ICD-10-CM | POA: Diagnosis not present

## 2019-08-27 DIAGNOSIS — D3617 Benign neoplasm of peripheral nerves and autonomic nervous system of trunk, unspecified: Secondary | ICD-10-CM | POA: Diagnosis not present

## 2019-10-06 DIAGNOSIS — Z03818 Encounter for observation for suspected exposure to other biological agents ruled out: Secondary | ICD-10-CM | POA: Diagnosis not present

## 2019-10-06 DIAGNOSIS — M940 Chondrocostal junction syndrome [Tietze]: Secondary | ICD-10-CM | POA: Diagnosis not present

## 2019-10-07 ENCOUNTER — Other Ambulatory Visit: Payer: Self-pay

## 2019-10-08 ENCOUNTER — Encounter: Payer: Self-pay | Admitting: Obstetrics and Gynecology

## 2019-10-08 ENCOUNTER — Ambulatory Visit (INDEPENDENT_AMBULATORY_CARE_PROVIDER_SITE_OTHER): Payer: BC Managed Care – PPO | Admitting: Obstetrics and Gynecology

## 2019-10-08 VITALS — BP 118/76 | Ht 63.0 in | Wt 132.0 lb

## 2019-10-08 DIAGNOSIS — M8580 Other specified disorders of bone density and structure, unspecified site: Secondary | ICD-10-CM

## 2019-10-08 DIAGNOSIS — Z01419 Encounter for gynecological examination (general) (routine) without abnormal findings: Secondary | ICD-10-CM

## 2019-10-08 NOTE — Patient Instructions (Signed)
We will plan to repeat the DEXA/bone density scan this year, please schedule.  Continue weight bearing exercise, and vitamin D/calcium intake.

## 2019-10-08 NOTE — Progress Notes (Signed)
   Judy Mejia Oct 03, 1960 [id number]  SUBJECTIVE:  59 y.o. [id number] female for annual routine gynecologic exam and Pap smear. She has no gynecologic concerns. She has been seeing her primary care provider regarding return of costochondritis.    Current Outpatient Medications  Medication Sig Dispense Refill  . atorvastatin (LIPITOR) 10 MG tablet Take 10 mg by mouth daily.    . Calcium Carbonate-Vitamin D (CALCIUM + D PO) Take by mouth.      . Cholecalciferol (VITAMIN D PO) Take 1,000 Units by mouth.    Marland Kitchen MAGNESIUM PO Take by mouth.      . Multiple Vitamin (MULTIVITAMIN) tablet Take 1 tablet by mouth daily.       No current facility-administered medications for this visit.   Allergies: Patient has no known allergies.  No LMP recorded. Patient has had a hysterectomy.  Past medical history,surgical history, problem list, medications, allergies, family history and social history were all reviewed and documented as reviewed in the EPIC chart.  ROS:  Feeling well. No dyspnea or chest pain on exertion.  No abdominal pain, change in bowel habits, black or bloody stools.  No urinary tract symptoms. GYN ROS:  no abnormal bleeding, pelvic pain or discharge, no breast pain or new or enlarging lumps on self exam. No neurological complaints.  OBJECTIVE:  BP 118/76   Ht 5\' 3"  (1.6 m)   Wt 132 lb (59.9 kg)   BMI 23.38 kg/m  The patient appears well, alert, oriented x 3, in no distress. ENT normal.  Neck supple. No cervical or supraclavicular adenopathy or thyromegaly.  Lungs are clear, good air entry, no wheezes, rhonchi or rales. S1 and S2 normal, no murmurs, regular rate and rhythm.  Abdomen soft without tenderness, guarding, mass or organomegaly.  Neurological is normal, no focal findings.  BREAST EXAM: breasts appear normal, no suspicious masses, no skin or nipple changes or axillary nodes  PELVIC EXAM: VULVA: normal appearing vulva with no masses, tenderness or lesions, VAGINA: normal  appearing vagina with normal color and discharge, no lesions, CERVIX: surgically absent, UTERUS: surgically absent, vaginal cuff normal, ADNEXA: normal adnexa in size, nontender and no masses  Chaperone: [name] present during the examination  ASSESSMENT:  59 y.o. [id number] here for annual gynecologic exam  PLAN:   1. Postmenopausal.  No concerns with vaginal bleeding or significant hot flashes.  Prior [facility] for abnormal bleeding in 2001.  Sounds like she did retain her ovaries. 2. Pap smear 2019.  No significant history of abnormal Pap smears.  Next Pap smear due 2022 if she would want to continue screening, otherwise she would be a candidate to stop screening if she is comfortable with that.  We will readdress at her future visits. 3. Mammogram 12/2018.  Normal breast exam today. She is reminded to schedule an annual mammogram this year when due. 4. Colonoscopy 2017.  Recommended that she follow up at the recommended interval.   5.  Osteopenia.  DEXA 2019 T score -2.4 FRAX 7% / 1%.  Plan repeat DEXA this year. She will plan to schedule this. 6. Health maintenance.  No labs today as she normally has these completed with her primary care provider.   Return annually or sooner, prn.  Joseph Pierini MD  10/08/19

## 2020-02-06 DIAGNOSIS — L700 Acne vulgaris: Secondary | ICD-10-CM | POA: Diagnosis not present

## 2020-02-06 DIAGNOSIS — L821 Other seborrheic keratosis: Secondary | ICD-10-CM | POA: Diagnosis not present

## 2020-02-06 DIAGNOSIS — L820 Inflamed seborrheic keratosis: Secondary | ICD-10-CM | POA: Diagnosis not present

## 2020-02-06 DIAGNOSIS — D225 Melanocytic nevi of trunk: Secondary | ICD-10-CM | POA: Diagnosis not present

## 2020-02-23 ENCOUNTER — Other Ambulatory Visit: Payer: Self-pay | Admitting: Obstetrics and Gynecology

## 2020-02-23 DIAGNOSIS — Z1231 Encounter for screening mammogram for malignant neoplasm of breast: Secondary | ICD-10-CM

## 2020-03-11 ENCOUNTER — Ambulatory Visit
Admission: RE | Admit: 2020-03-11 | Discharge: 2020-03-11 | Disposition: A | Discharge status: Home / Self Care | Payer: BC Managed Care – PPO | Source: Ambulatory Visit

## 2020-03-11 ENCOUNTER — Other Ambulatory Visit: Payer: Self-pay

## 2020-03-11 DIAGNOSIS — Z1231 Encounter for screening mammogram for malignant neoplasm of breast: Secondary | ICD-10-CM | POA: Diagnosis not present

## 2020-03-16 ENCOUNTER — Other Ambulatory Visit: Payer: Self-pay

## 2020-03-16 ENCOUNTER — Ambulatory Visit (INDEPENDENT_AMBULATORY_CARE_PROVIDER_SITE_OTHER): Payer: BC Managed Care – PPO

## 2020-03-16 ENCOUNTER — Other Ambulatory Visit: Payer: Self-pay | Admitting: Obstetrics and Gynecology

## 2020-03-16 DIAGNOSIS — M8580 Other specified disorders of bone density and structure, unspecified site: Secondary | ICD-10-CM

## 2020-03-16 DIAGNOSIS — M8589 Other specified disorders of bone density and structure, multiple sites: Secondary | ICD-10-CM

## 2020-03-16 DIAGNOSIS — Z780 Asymptomatic menopausal state: Secondary | ICD-10-CM

## 2020-03-16 DIAGNOSIS — Z01419 Encounter for gynecological examination (general) (routine) without abnormal findings: Secondary | ICD-10-CM

## 2020-06-07 DIAGNOSIS — R519 Headache, unspecified: Secondary | ICD-10-CM | POA: Diagnosis not present

## 2020-09-08 DIAGNOSIS — B001 Herpesviral vesicular dermatitis: Secondary | ICD-10-CM | POA: Diagnosis not present

## 2020-09-08 DIAGNOSIS — R7301 Impaired fasting glucose: Secondary | ICD-10-CM | POA: Diagnosis not present

## 2020-09-08 DIAGNOSIS — E785 Hyperlipidemia, unspecified: Secondary | ICD-10-CM | POA: Diagnosis not present

## 2020-12-25 IMAGING — MG DIGITAL SCREENING BILAT W/ TOMO W/ CAD
8 series · 9 of 24 positions shown · non-contrast
Comparison: Previous exam(s).

CLINICAL DATA: Screening.

EXAM:
DIGITAL SCREENING BILATERAL MAMMOGRAM WITH TOMO AND CAD

[L CC synth-2D]
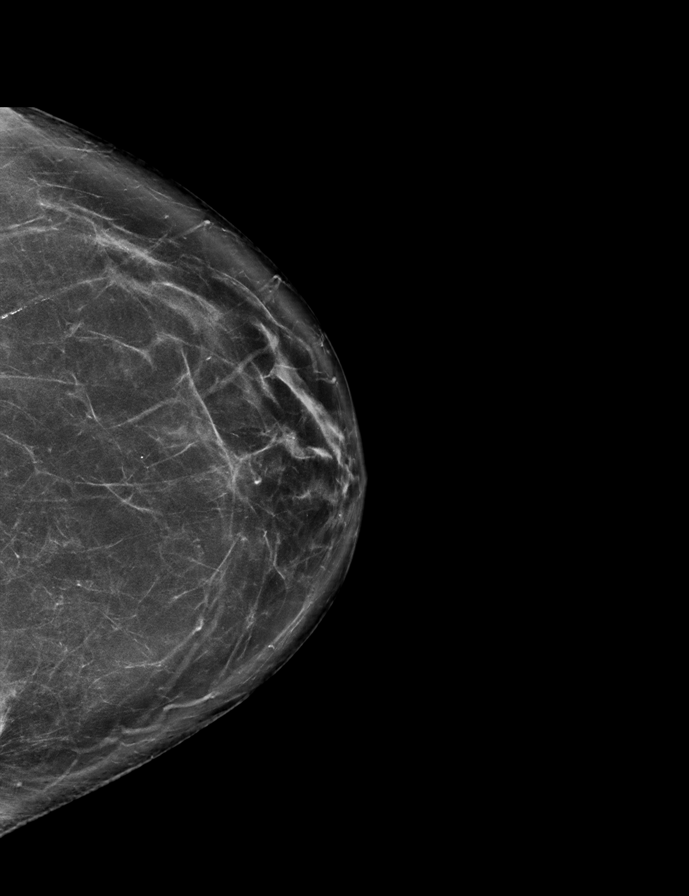

[R CC synth-2D]
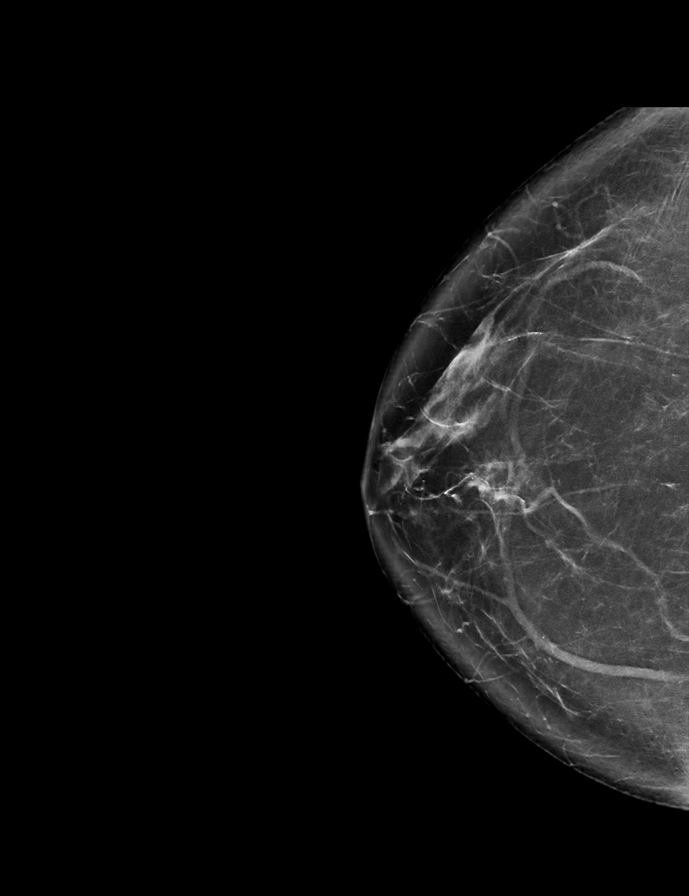

[R MLO synth-2D]
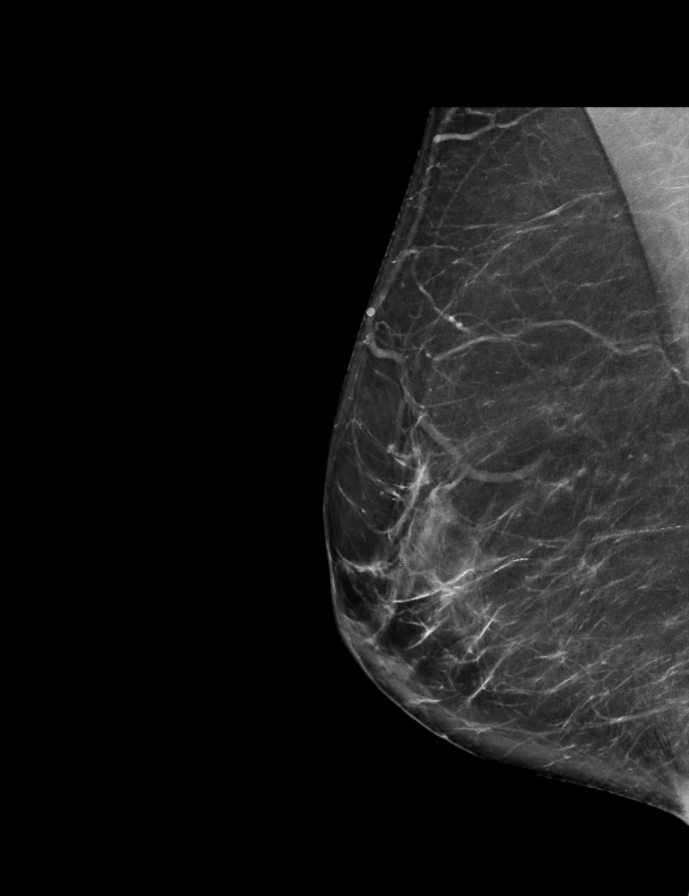

[L MLO synth-2D]
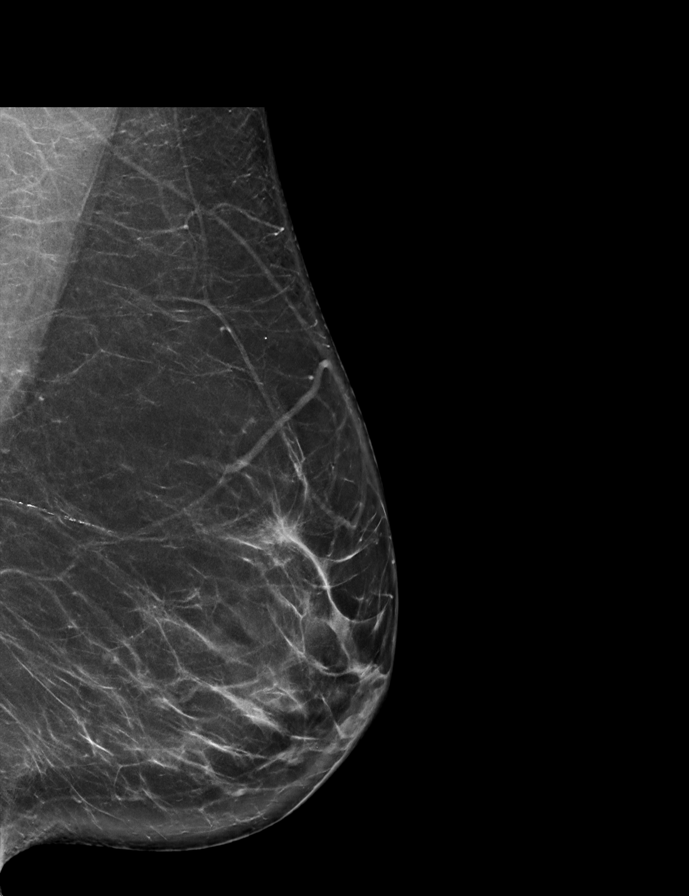

[L CC tomo · 2 of 80 frames shown]
[frame 26/80]
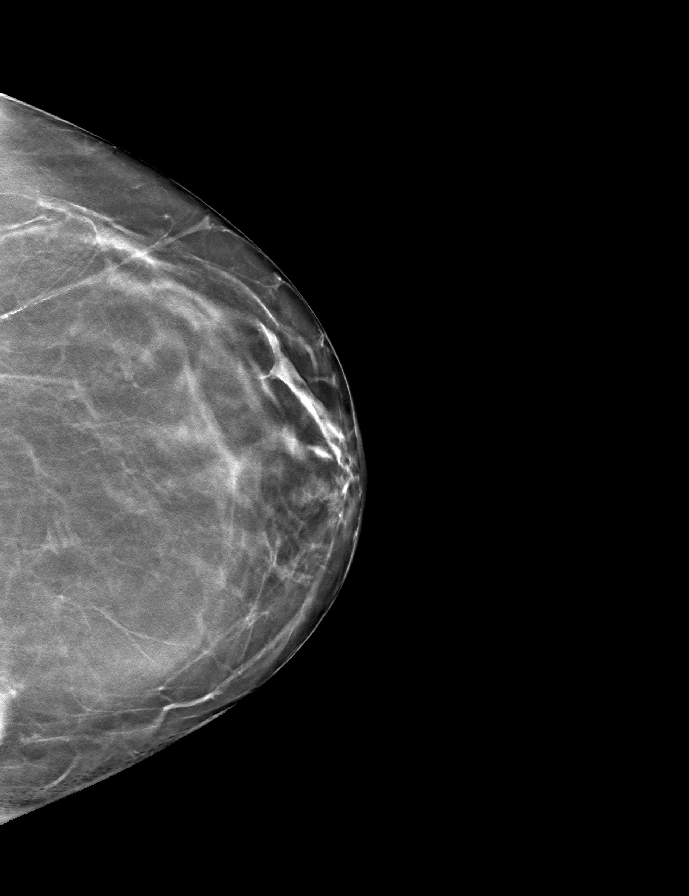
[frame 41/80]
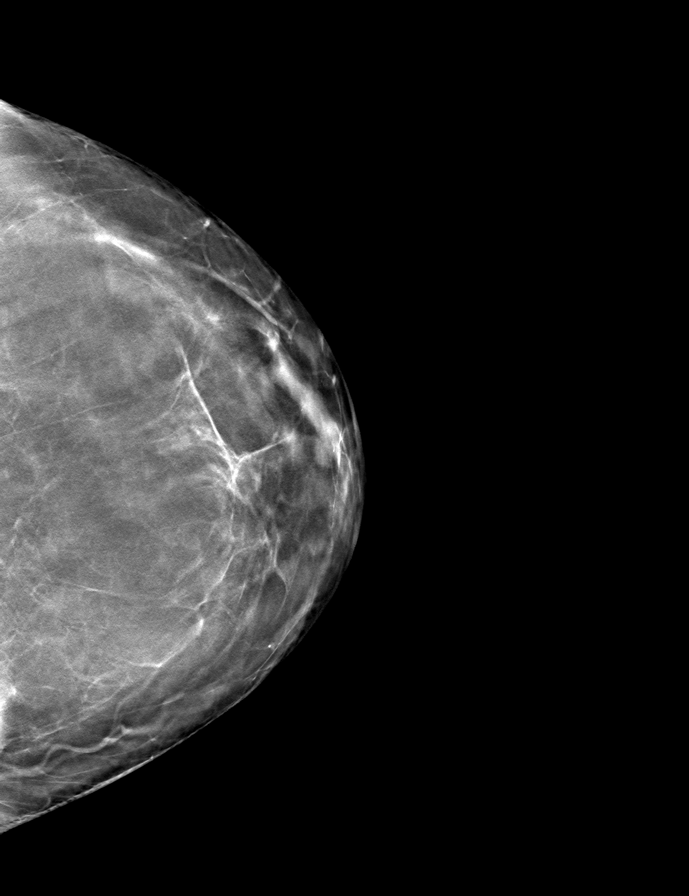

[L MLO tomo · tomo slice 39/77.0]
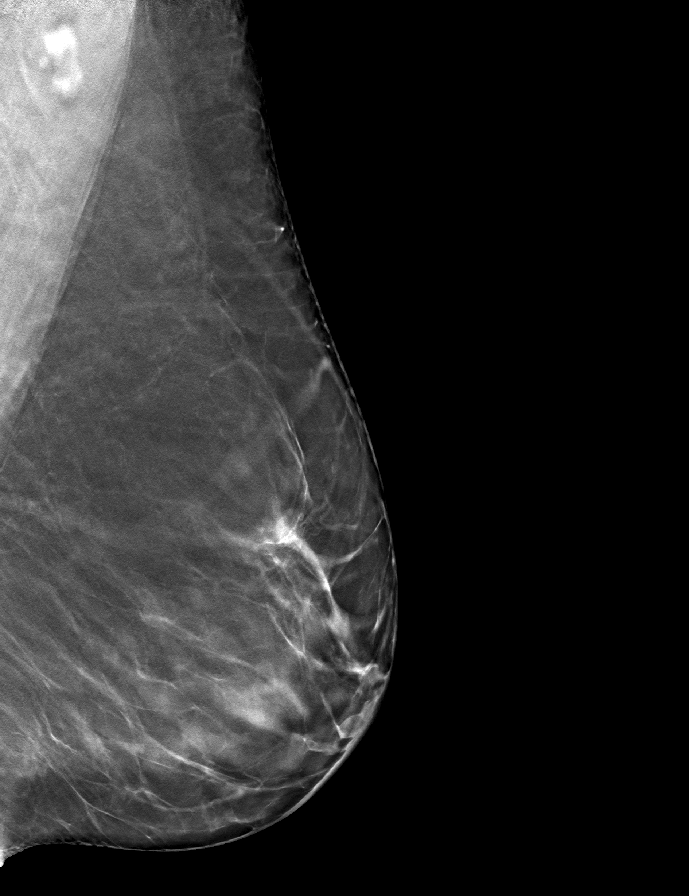

[R CC tomo · tomo slice 38/75.0]
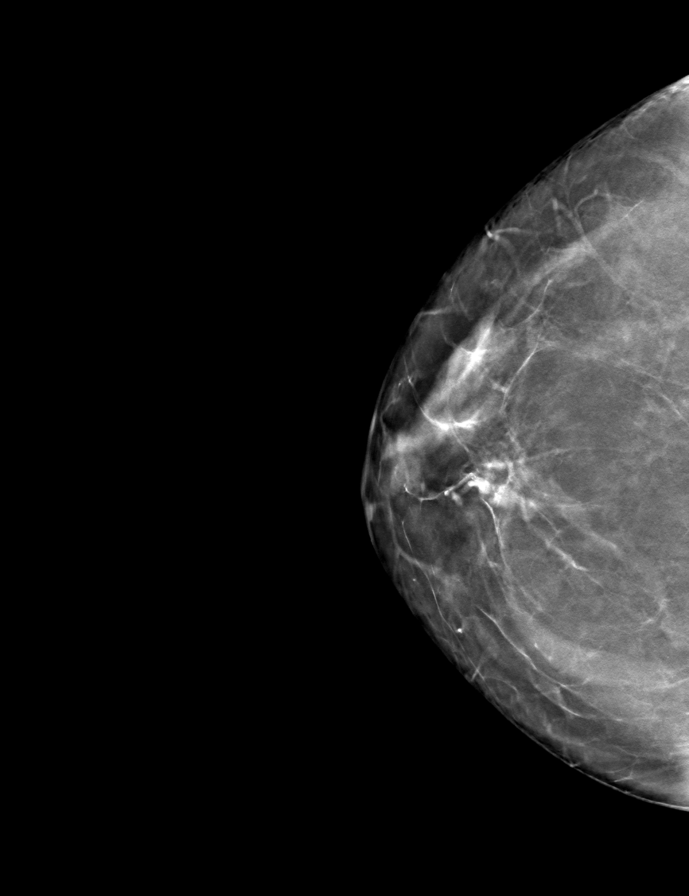

[R MLO tomo · tomo slice 37/74.0]
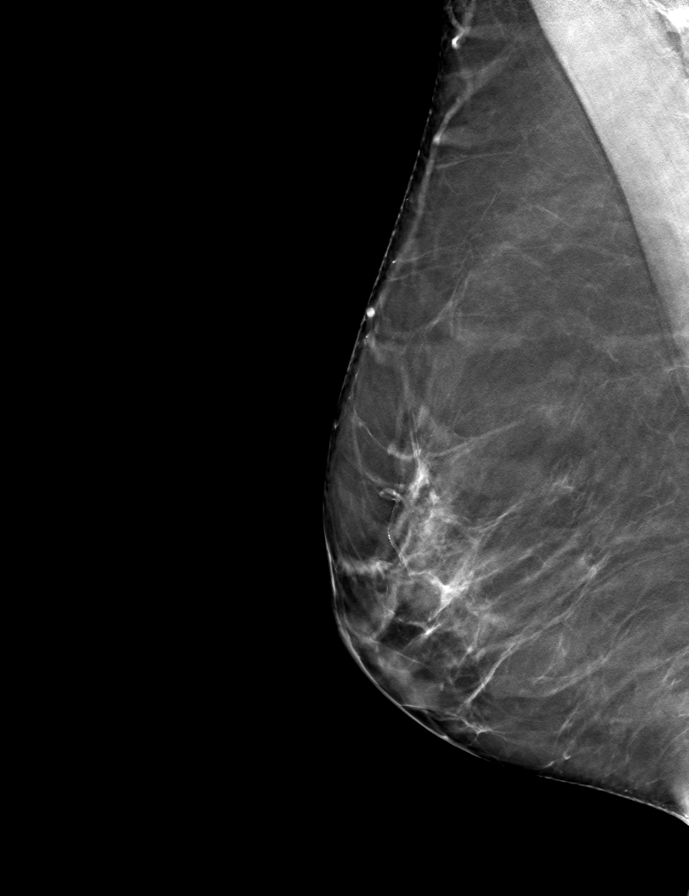

[9 of 24 positions shown; findings below may reference images not displayed]

ACR Breast Density Category b: There are scattered areas of
fibroglandular density.
FINDINGS: There are no findings suspicious for malignancy. Images were
processed with CAD.
IMPRESSION: No mammographic evidence of malignancy. A result letter of this
screening mammogram will be mailed directly to the patient.

RECOMMENDATION:
Screening mammogram in one year. (Code:[id number])

BI-RADS CATEGORY  1: Negative.

## 2020-12-29 NOTE — Progress Notes (Signed)
60 y.o. G89P2002 Married Caucasian female here for annual exam.    Voiding often because she is drinking a lot of water.  Voids every 2 hours.  No loss of urine.   Works from home.  Spending a lot of time at home during the pandemic.  Working on weight loss with her PCP.  Became a grandmother.  Has a 31 month old grand daughter.   Received her second booster for Covid.   PCP: Pershing Cox MD  No LMP recorded. Patient has had a hysterectomy.           Sexually active: Yes.    Not currently due to husband's health.  The current method of family planning is post hysterectomy. Exercising: Yes.    Walking/stretching  Smoker:  no  Health Maintenance: Pap:  09/26/2017 Neg, 2016 Neg History of abnormal Pap:  No MMG: 03/11/2020 3D/Neg/ Density B/BiRads 1.  Has appointment in August.  Colonoscopy: 2017.  Due this year.  Dr. Paulita Fujita.  BMD:   03/16/2020 osteopenia.  FRAX - 10% risk of major fracture and 1.6% risk of hip fracture.  TDaP: yrs ago  HIV: no Hep C: no  Screening Labs: PCP   reports that she has never smoked. She has never used smokeless tobacco. She reports current alcohol use. She reports that she does not use drugs.  Past Medical History:  Diagnosis Date   Depression    Elevated cholesterol    Fainting spell    Melanoma ([facility])    Osteopenia 12/2017   T score -2.4 FRAX 7.5% / 1%    Past Surgical History:  Procedure Laterality Date   Anal fistula repair     BUNIONECTOMY     DILATION AND CURETTAGE OF UTERUS     HYSTEROSCOPY     MELANOMA EXCISION     TUBAL LIGATION     VAGINAL HYSTERECTOMY  2001   DUB    Current Outpatient Medications  Medication Sig Dispense Refill   atorvastatin (LIPITOR) 10 MG tablet Take 10 mg by mouth daily.     Calcium Carbonate-Vitamin D (CALCIUM + D PO) Take by mouth.       Cholecalciferol (VITAMIN D PO) Take 1,000 Units by mouth.     MAGNESIUM PO Take by mouth.       Multiple Vitamin (MULTIVITAMIN) tablet Take 1 tablet by mouth daily.        No current facility-administered medications for this visit.    Family History  Problem Relation Age of Onset   Hypertension Mother    Cancer Mother        Colon and Stomach cancer   Cancer Father        Liver/Lung    Review of Systems  Constitutional: Negative.   HENT: Negative.    Eyes: Negative.   Respiratory: Negative.    Cardiovascular: Negative.   Gastrointestinal: Negative.   Endocrine: Negative.   Genitourinary: Negative.   Musculoskeletal: Negative.   Skin: Negative.   Allergic/Immunologic: Negative.   Neurological: Negative.   Hematological: Negative.   Psychiatric/Behavioral: Negative.     Exam:   BP 128/80   Pulse 71   Ht 5' 2.5" (1.588 m)   Wt 129 lb (58.5 kg)   SpO2 98%   BMI 23.22 kg/m     General appearance: alert, cooperative and appears stated age Head: normocephalic, without obvious abnormality, atraumatic Neck: no adenopathy, supple, symmetrical, trachea midline and thyroid normal to inspection and palpation Lungs: clear to auscultation bilaterally Breasts: normal  appearance, no masses or tenderness, No nipple retraction or dimpling, No nipple discharge or bleeding, No axillary adenopathy Heart: regular rate and rhythm Abdomen: soft, non-tender; no masses, no organomegaly Extremities: extremities normal, atraumatic, no cyanosis or edema Skin: skin color, texture, turgor normal. No rashes or lesions Lymph nodes: cervical, supraclavicular, and axillary nodes normal. Neurologic: grossly normal  Pelvic: External genitalia:  no lesions              No abnormal inguinal nodes palpated.              Urethra:  normal appearing urethra with no masses, tenderness or lesions              Bartholins and Skenes: normal                 Vagina: atrophy changes note.              Cervix: absent              Pap taken: no Bimanual Exam:  Uterus: absent              Adnexa: no mass, fullness, tenderness              Rectal exam: yes. Confirms.               Anus:  normal sphincter tone, no lesions  Chaperone was present for exam.  Assessment:   Well woman visit with normal exam. Status post TVH for abnormal uterine bleeding in 2001. Ovaries remain.  Vaginal atrophy. Osteopenia.   FH colon cancer in mother.  Hx melanoma.  Plan: Mammogram screening discussed. Self breast awareness reviewed. No pap today.  Guidelines reviewed.  Patient comfortable with this.  Guidelines for Calcium, Vitamin D, regular exercise program including cardiovascular and weight bearing exercise. Labs and vaccines with PCP.  We discussed vaginal atrophy and treatment options - water based lubricants, cooking oils, vaginal estrogen.  I discussed potential effect of estrogen on breast cancer. BMD in 2023. Follow up annually and prn.     After visit summary provided.

## 2021-01-04 ENCOUNTER — Other Ambulatory Visit: Payer: Self-pay | Admitting: Obstetrics and Gynecology

## 2021-01-04 DIAGNOSIS — Z1231 Encounter for screening mammogram for malignant neoplasm of breast: Secondary | ICD-10-CM

## 2021-01-12 ENCOUNTER — Ambulatory Visit (INDEPENDENT_AMBULATORY_CARE_PROVIDER_SITE_OTHER): Payer: BC Managed Care – PPO | Admitting: Obstetrics and Gynecology

## 2021-01-12 ENCOUNTER — Encounter: Payer: Self-pay | Admitting: Obstetrics and Gynecology

## 2021-01-12 ENCOUNTER — Other Ambulatory Visit: Payer: Self-pay

## 2021-01-12 VITALS — BP 128/80 | HR 71 | Ht 62.5 in | Wt 129.0 lb

## 2021-01-12 DIAGNOSIS — Z01419 Encounter for gynecological examination (general) (routine) without abnormal findings: Secondary | ICD-10-CM | POA: Diagnosis not present

## 2021-01-12 NOTE — Patient Instructions (Signed)

## 2021-02-18 DIAGNOSIS — Z20822 Contact with and (suspected) exposure to covid-19: Secondary | ICD-10-CM | POA: Diagnosis not present

## 2021-03-01 DIAGNOSIS — D1801 Hemangioma of skin and subcutaneous tissue: Secondary | ICD-10-CM | POA: Diagnosis not present

## 2021-03-01 DIAGNOSIS — L820 Inflamed seborrheic keratosis: Secondary | ICD-10-CM | POA: Diagnosis not present

## 2021-03-01 DIAGNOSIS — Z85820 Personal history of malignant melanoma of skin: Secondary | ICD-10-CM | POA: Diagnosis not present

## 2021-03-01 DIAGNOSIS — L821 Other seborrheic keratosis: Secondary | ICD-10-CM | POA: Diagnosis not present

## 2021-03-01 DIAGNOSIS — D2272 Melanocytic nevi of left lower limb, including hip: Secondary | ICD-10-CM | POA: Diagnosis not present

## 2021-03-14 ENCOUNTER — Ambulatory Visit
Admission: RE | Admit: 2021-03-14 | Discharge: 2021-03-14 | Disposition: A | Discharge status: Home / Self Care | Payer: BC Managed Care – PPO | Source: Ambulatory Visit

## 2021-03-14 ENCOUNTER — Other Ambulatory Visit: Payer: Self-pay

## 2021-03-14 DIAGNOSIS — Z1231 Encounter for screening mammogram for malignant neoplasm of breast: Secondary | ICD-10-CM | POA: Diagnosis not present

## 2021-04-29 DIAGNOSIS — Z23 Encounter for immunization: Secondary | ICD-10-CM | POA: Diagnosis not present

## 2021-08-09 DIAGNOSIS — B349 Viral infection, unspecified: Secondary | ICD-10-CM | POA: Diagnosis not present

## 2021-08-14 DIAGNOSIS — B349 Viral infection, unspecified: Secondary | ICD-10-CM | POA: Diagnosis not present

## 2021-08-16 DIAGNOSIS — H1031 Unspecified acute conjunctivitis, right eye: Secondary | ICD-10-CM | POA: Diagnosis not present

## 2021-09-12 DIAGNOSIS — B001 Herpesviral vesicular dermatitis: Secondary | ICD-10-CM | POA: Diagnosis not present

## 2021-09-12 DIAGNOSIS — E785 Hyperlipidemia, unspecified: Secondary | ICD-10-CM | POA: Diagnosis not present

## 2021-09-12 DIAGNOSIS — R7303 Prediabetes: Secondary | ICD-10-CM | POA: Diagnosis not present

## 2021-09-28 DIAGNOSIS — Z8616 Personal history of COVID-19: Secondary | ICD-10-CM

## 2021-09-28 HISTORY — DX: Personal history of COVID-19: Z86.16

## 2021-09-30 DIAGNOSIS — Z800 Family history of malignant neoplasm of digestive organs: Secondary | ICD-10-CM | POA: Diagnosis not present

## 2021-09-30 DIAGNOSIS — Z1211 Encounter for screening for malignant neoplasm of colon: Secondary | ICD-10-CM | POA: Diagnosis not present

## 2021-09-30 DIAGNOSIS — D123 Benign neoplasm of transverse colon: Secondary | ICD-10-CM | POA: Diagnosis not present

## 2021-09-30 DIAGNOSIS — Z8371 Family history of colonic polyps: Secondary | ICD-10-CM | POA: Diagnosis not present

## 2021-10-03 DIAGNOSIS — J029 Acute pharyngitis, unspecified: Secondary | ICD-10-CM | POA: Diagnosis not present

## 2021-10-03 DIAGNOSIS — B349 Viral infection, unspecified: Secondary | ICD-10-CM | POA: Diagnosis not present

## 2021-10-03 DIAGNOSIS — U071 COVID-19: Secondary | ICD-10-CM | POA: Diagnosis not present

## 2022-01-26 ENCOUNTER — Other Ambulatory Visit: Payer: Self-pay

## 2022-01-26 DIAGNOSIS — M8580 Other specified disorders of bone density and structure, unspecified site: Secondary | ICD-10-CM

## 2022-01-27 ENCOUNTER — Other Ambulatory Visit: Payer: Self-pay | Admitting: Family Medicine

## 2022-01-27 DIAGNOSIS — Z1231 Encounter for screening mammogram for malignant neoplasm of breast: Secondary | ICD-10-CM

## 2022-03-16 DIAGNOSIS — Z1231 Encounter for screening mammogram for malignant neoplasm of breast: Secondary | ICD-10-CM

## 2022-03-28 ENCOUNTER — Ambulatory Visit
Admission: RE | Admit: 2022-03-28 | Discharge: 2022-03-28 | Disposition: A | Discharge status: Home / Self Care | Payer: BC Managed Care – PPO | Source: Ambulatory Visit | Attending: Family Medicine | Admitting: Family Medicine

## 2022-03-28 DIAGNOSIS — Z1231 Encounter for screening mammogram for malignant neoplasm of breast: Secondary | ICD-10-CM | POA: Diagnosis not present

## 2022-03-28 NOTE — Progress Notes (Unsigned)
61 y.o. G19P2002 Married Caucasian female here for annual exam.    PCP: Donald Prose, MD  No LMP recorded. Patient has had a hysterectomy.           Sexually active: {yes no:314532}  The current method of family planning is status post hysterectomy.    Exercising: {yes no:314532}  {types:19826} Smoker:  no  Health Maintenance: Pap:   09/26/2017 Neg, 2016 Neg, 2012 Neg History of abnormal Pap:  no MMG:  ***03-28-22 pend Colonoscopy:  ***2017 or updated 2022 w/Dr.Outlaw BMD: 03-16-20  Result :Osteopenia.  FRAX - 10% risk of major fracture and 1.6% risk of hip fracture.  HAS APPT. 04-05-22 TDaP:  ***years ago Gardasil:   no HIV:no Hep C:no Screening Labs:  Hb today: ***, Urine today: ***   reports that she has never smoked. She has never used smokeless tobacco. She reports current alcohol use. She reports that she does not use drugs.  Past Medical History:  Diagnosis Date   Depression    Elevated cholesterol    Fainting spell    Melanoma ([facility])    Osteopenia 12/2017   T score -2.4 FRAX 7.5% / 1%    Past Surgical History:  Procedure Laterality Date   Anal fistula repair     BUNIONECTOMY     DILATION AND CURETTAGE OF UTERUS     HYSTEROSCOPY     MELANOMA EXCISION     TUBAL LIGATION     VAGINAL HYSTERECTOMY  2001   DUB    Current Outpatient Medications  Medication Sig Dispense Refill   atorvastatin (LIPITOR) 10 MG tablet Take 10 mg by mouth daily.     Calcium Carbonate-Vitamin D (CALCIUM + D PO) Take by mouth.       Cholecalciferol (VITAMIN D PO) Take 1,000 Units by mouth.     MAGNESIUM PO Take by mouth.       Multiple Vitamin (MULTIVITAMIN) tablet Take 1 tablet by mouth daily.       No current facility-administered medications for this visit.    Family History  Problem Relation Age of Onset   Hypertension Mother    Cancer Mother        Colon and Stomach cancer   Cancer Father        Liver/Lung    Review of Systems  Exam:   There were no vitals taken for this  visit.    General appearance: alert, cooperative and appears stated age Head: normocephalic, without obvious abnormality, atraumatic Neck: no adenopathy, supple, symmetrical, trachea midline and thyroid normal to inspection and palpation Lungs: clear to auscultation bilaterally Breasts: normal appearance, no masses or tenderness, No nipple retraction or dimpling, No nipple discharge or bleeding, No axillary adenopathy Heart: regular rate and rhythm Abdomen: soft, non-tender; no masses, no organomegaly Extremities: extremities normal, atraumatic, no cyanosis or edema Skin: skin color, texture, turgor normal. No rashes or lesions Lymph nodes: cervical, supraclavicular, and axillary nodes normal. Neurologic: grossly normal  Pelvic: External genitalia:  no lesions              No abnormal inguinal nodes palpated.              Urethra:  normal appearing urethra with no masses, tenderness or lesions              Bartholins and Skenes: normal                 Vagina: normal appearing vagina with normal color and discharge, no lesions  Cervix: no lesions              Pap taken: {yes no:314532} Bimanual Exam:  Uterus:  normal size, contour, position, consistency, mobility, non-tender              Adnexa: no mass, fullness, tenderness              Rectal exam: {yes no:314532}.  Confirms.              Anus:  normal sphincter tone, no lesions  Chaperone was present for exam:  ***  Assessment:   Well woman visit with gynecologic exam.   Plan: Mammogram screening discussed. Self breast awareness reviewed. Pap and HR HPV as above. Guidelines for Calcium, Vitamin D, regular exercise program including cardiovascular and weight bearing exercise.   Follow up annually and prn.   Additional counseling given.  {yes no:314532}. _______ minutes face to face time of which over 50% was spent in counseling.    After visit summary provided.     

## 2022-03-30 ENCOUNTER — Encounter: Payer: Self-pay | Admitting: Obstetrics and Gynecology

## 2022-03-30 ENCOUNTER — Other Ambulatory Visit (HOSPITAL_COMMUNITY)
Admission: RE | Admit: 2022-03-30 | Discharge: 2022-03-30 | Disposition: A | Discharge status: Home / Self Care | Payer: BC Managed Care – PPO | Source: Ambulatory Visit | Attending: Obstetrics and Gynecology | Admitting: Obstetrics and Gynecology

## 2022-03-30 ENCOUNTER — Ambulatory Visit (INDEPENDENT_AMBULATORY_CARE_PROVIDER_SITE_OTHER): Payer: BC Managed Care – PPO | Admitting: Obstetrics and Gynecology

## 2022-03-30 VITALS — BP 140/82 | HR 73 | Ht 62.5 in | Wt 121.0 lb

## 2022-03-30 DIAGNOSIS — Z124 Encounter for screening for malignant neoplasm of cervix: Secondary | ICD-10-CM | POA: Insufficient documentation

## 2022-03-30 DIAGNOSIS — Z1272 Encounter for screening for malignant neoplasm of vagina: Secondary | ICD-10-CM

## 2022-03-30 DIAGNOSIS — Z01419 Encounter for gynecological examination (general) (routine) without abnormal findings: Secondary | ICD-10-CM | POA: Diagnosis not present

## 2022-03-30 NOTE — Patient Instructions (Signed)

## 2022-04-04 LAB — CYTOLOGY - PAP
Comment: NEGATIVE
Diagnosis: NEGATIVE
High risk HPV: NEGATIVE

## 2022-04-05 ENCOUNTER — Ambulatory Visit (INDEPENDENT_AMBULATORY_CARE_PROVIDER_SITE_OTHER): Payer: BC Managed Care – PPO

## 2022-04-05 ENCOUNTER — Other Ambulatory Visit: Payer: Self-pay | Admitting: Obstetrics and Gynecology

## 2022-04-05 ENCOUNTER — Encounter: Payer: Self-pay | Admitting: Obstetrics and Gynecology

## 2022-04-05 DIAGNOSIS — Z780 Asymptomatic menopausal state: Secondary | ICD-10-CM | POA: Diagnosis not present

## 2022-04-05 DIAGNOSIS — Z13820 Encounter for screening for osteoporosis: Secondary | ICD-10-CM

## 2022-04-05 DIAGNOSIS — M8580 Other specified disorders of bone density and structure, unspecified site: Secondary | ICD-10-CM

## 2022-04-05 DIAGNOSIS — M8589 Other specified disorders of bone density and structure, multiple sites: Secondary | ICD-10-CM

## 2022-04-07 DIAGNOSIS — L821 Other seborrheic keratosis: Secondary | ICD-10-CM | POA: Diagnosis not present

## 2022-04-07 DIAGNOSIS — B078 Other viral warts: Secondary | ICD-10-CM | POA: Diagnosis not present

## 2022-04-07 DIAGNOSIS — L820 Inflamed seborrheic keratosis: Secondary | ICD-10-CM | POA: Diagnosis not present

## 2022-04-07 DIAGNOSIS — L718 Other rosacea: Secondary | ICD-10-CM | POA: Diagnosis not present

## 2022-04-07 DIAGNOSIS — D225 Melanocytic nevi of trunk: Secondary | ICD-10-CM | POA: Diagnosis not present

## 2022-04-07 DIAGNOSIS — L400 Psoriasis vulgaris: Secondary | ICD-10-CM | POA: Diagnosis not present

## 2022-05-12 DIAGNOSIS — Z23 Encounter for immunization: Secondary | ICD-10-CM | POA: Diagnosis not present

## 2023-01-12 ENCOUNTER — Other Ambulatory Visit: Payer: Self-pay | Admitting: Obstetrics and Gynecology

## 2023-01-12 DIAGNOSIS — Z1231 Encounter for screening mammogram for malignant neoplasm of breast: Secondary | ICD-10-CM

## 2023-04-03 ENCOUNTER — Ambulatory Visit
Admission: RE | Admit: 2023-04-03 | Discharge: 2023-04-03 | Discharge status: Home / Self Care | Payer: Commercial Managed Care - HMO | Source: Ambulatory Visit | Attending: Obstetrics and Gynecology | Admitting: Obstetrics and Gynecology

## 2023-04-03 DIAGNOSIS — Z1231 Encounter for screening mammogram for malignant neoplasm of breast: Secondary | ICD-10-CM

## 2023-05-10 NOTE — Progress Notes (Deleted)
62 y.o. G24P2002 Married Caucasian female here for annual exam.    PCP: Deatra James, MD   No LMP recorded. Patient has had a hysterectomy.           Sexually active: {yes no:314532}  The current method of family planning is status post hysterectomy.    Exercising: {yes no:314532}  {types:19826} Smoker:  no  OB History  Gravida Para Term Preterm AB Living  2 2 2     2   SAB IAB Ectopic Multiple Live Births               # Outcome Date GA Lbr Len/2nd Weight Sex Type Anes PTL Lv  2 Term           1 Term              Health Maintenance: Pap:  03/30/22 neg: HR HPV neg, 09/26/2017 Neg, 2016 Neg, 2012 Neg  History of abnormal Pap:  no MMG: 04/03/23 Breast Density Cat B, BI-RADS CAT 1 neg Colonoscopy:   [facility] Colonoscopy          Overdue - Colonoscopy (Every 10 Years) Overdue since 07/31/2018    07/31/2008  Done - per patient with Dr.Outlaw   Only the first 1 history entries have been loaded, but more history exists.           BMD:  04/05/22  Result  osteopenia  HIV: n/a Hep C: n/a  Immunization History  Administered Date(s) Administered   Influenza Inj Mdck Quad Pf 06/11/2015, 05/20/2017, 04/19/2018   Influenza-Unspecified 04/25/2019   Zoster Recombinant(Shingrix) 05/02/2018, 08/19/2018     {Labs (Optional):23779}   reports that she has never smoked. She has never used smokeless tobacco. She reports current alcohol use. She reports that she does not use drugs.  Past Medical History:  Diagnosis Date   Depression    Elevated cholesterol    Fainting spell    History of COVID-19 09/28/2021   Melanoma ([facility])    Osteopenia 12/2017   T score -2.4 FRAX 7.5% / 1%    Past Surgical History:  Procedure Laterality Date   Anal fistula repair     BUNIONECTOMY     DILATION AND CURETTAGE OF UTERUS     HYSTEROSCOPY     MELANOMA EXCISION     TUBAL LIGATION     VAGINAL HYSTERECTOMY  2001   DUB    Current Outpatient Medications  Medication Sig Dispense Refill   atorvastatin  (LIPITOR) 10 MG tablet Take 10 mg by mouth daily.     Calcium Carbonate-Vitamin D (CALCIUM + D PO) Take by mouth.       Cholecalciferol (VITAMIN D PO) Take 1,000 Units by mouth.     ipratropium (ATROVENT) 0.06 % nasal spray Place 2 sprays into both nostrils 4 (four) times daily.     MAGNESIUM PO Take by mouth.       Multiple Vitamin (MULTIVITAMIN) tablet Take 1 tablet by mouth daily.       Omega-3 Krill Oil 500 MG CAPS      No current facility-administered medications for this visit.    Family History  Problem Relation Age of Onset   Hypertension Mother    Cancer Mother        Colon and Stomach cancer   Cancer Father        Liver/Lung    Review of Systems  Exam:   There were no vitals taken for this visit.    General appearance: alert,  cooperative and appears stated age Head: normocephalic, without obvious abnormality, atraumatic Neck: no adenopathy, supple, symmetrical, trachea midline and thyroid normal to inspection and palpation Lungs: clear to auscultation bilaterally Breasts: normal appearance, no masses or tenderness, No nipple retraction or dimpling, No nipple discharge or bleeding, No axillary adenopathy Heart: regular rate and rhythm Abdomen: soft, non-tender; no masses, no organomegaly Extremities: extremities normal, atraumatic, no cyanosis or edema Skin: skin color, texture, turgor normal. No rashes or lesions Lymph nodes: cervical, supraclavicular, and axillary nodes normal. Neurologic: grossly normal  Pelvic: External genitalia:  no lesions              No abnormal inguinal nodes palpated.              Urethra:  normal appearing urethra with no masses, tenderness or lesions              Bartholins and Skenes: normal                 Vagina: normal appearing vagina with normal color and discharge, no lesions              Cervix: no lesions              Pap taken: {yes no:314532} Bimanual Exam:  Uterus:  normal size, contour, position, consistency, mobility,  non-tender              Adnexa: no mass, fullness, tenderness              Rectal exam: {yes no:314532}.  Confirms.              Anus:  normal sphincter tone, no lesions  Chaperone was present for exam:  {BSCHAPERONE:31226::"Adi Seales F, CMA"}   Assessment and Plan:   ***  Mammogram screening discussed. Self breast awareness reviewed. Guidelines for Calcium, Vitamin D, regular exercise program including cardiovascular and weight bearing exercise.   No follow-ups on file.   Additional counseling given.  {yes [id number]. _______ minutes face to face time of which over 50% was spent in counseling.    After visit summary provided.

## 2023-05-24 ENCOUNTER — Ambulatory Visit: Payer: BC Managed Care – PPO | Admitting: Obstetrics and Gynecology

## 2023-10-01 NOTE — Progress Notes (Signed)
 63 y.o. G27P2002 Married Caucasian female here for annual exam.    Asking what to look for to indicate she has potential pelvic floor problems following hysterectomy.  She does Kegel's.  States a friend had surgery for bladder problems.  Drinks a lot of water and voids often.  Denies urinary incontinence.  Voids well.  No constipation.  Denies accidental leakage of stool.  No pain with intercourse.   No vaginal protrusion.   Sees Dermatology.   PCP: Deatra James, MD   No LMP recorded. Patient has had a hysterectomy.           Sexually active: Yes.    The current method of family planning is status post hysterectomy.    Menopausal hormone therapy:  n/a Exercising: Yes.     walking Smoker:  no  OB History  Gravida Para Term Preterm AB Living  2 2 2   2   SAB IAB Ectopic Multiple Live Births          # Outcome Date GA Lbr Len/2nd Weight Sex Type Anes PTL Lv  2 Term           1 Term              HEALTH MAINTENANCE: Last 2 paps:  03/30/22 neg: HR HPV neg, 09/26/17 neg History of abnormal Pap or positive HPV:  no Mammogram:   04/03/23 Breast Density Cat B, BI-RADS CAT 1 neg Colonoscopy:  09/2021 - [facility].   Bone Density:  04/05/22  Result  osteopenia of hips and spine.  Immunization History  Administered Date(s) Administered   Influenza Inj Mdck Quad Pf 06/11/2015, 05/20/2017, 04/19/2018   Influenza-Unspecified 04/25/2019   Zoster Recombinant(Shingrix) 05/02/2018, 08/19/2018  Tetanus done last week.  Pneumonia vaccine done last week.    reports that she has never smoked. She has never used smokeless tobacco. She reports current alcohol use. She reports that she does not use drugs.  Past Medical History:  Diagnosis Date   Depression    Elevated cholesterol    Fainting spell    History of COVID-19 09/28/2021   Melanoma ([facility])    Osteopenia 12/2017   T score -2.4 FRAX 7.5% / 1%    Past Surgical History:  Procedure Laterality Date   Anal fistula repair      BUNIONECTOMY     DILATION AND CURETTAGE OF UTERUS     HYSTEROSCOPY     MELANOMA EXCISION     TUBAL LIGATION     VAGINAL HYSTERECTOMY  2001   DUB    Current Outpatient Medications  Medication Sig Dispense Refill   atorvastatin (LIPITOR) 10 MG tablet Take 10 mg by mouth daily.     Calcium Carbonate-Vitamin D (CALCIUM + D PO) Take by mouth.       Cholecalciferol (VITAMIN D PO) Take 1,000 Units by mouth.     ipratropium (ATROVENT) 0.06 % nasal spray Place 2 sprays into both nostrils 4 (four) times daily.     MAGNESIUM PO Take by mouth.       Multiple Vitamin (MULTIVITAMIN) tablet Take 1 tablet by mouth daily.       Omega-3 Krill Oil 500 MG CAPS      No current facility-administered medications for this visit.    ALLERGIES: Patient has no known allergies.  Family History  Problem Relation Age of Onset   Hypertension Mother    Cancer Mother        Colon and Stomach cancer   Cancer Father  Liver/Lung    Review of Systems  All other systems reviewed and are negative.   PHYSICAL EXAM:  BP 128/72 (BP Location: Left Arm, Patient Position: Sitting, Cuff Size: Small)   Pulse 75   Ht 5' 4.5" (1.638 m)   Wt 120 lb (54.4 kg)   SpO2 99%   BMI 20.28 kg/m     General appearance: alert, cooperative and appears stated age Head: normocephalic, without obvious abnormality, atraumatic Neck: no adenopathy, supple, symmetrical, trachea midline and thyroid normal to inspection and palpation Lungs: clear to auscultation bilaterally Breasts: normal appearance, no masses or tenderness, No nipple retraction or dimpling, No nipple discharge or bleeding, No axillary adenopathy Heart: regular rate and rhythm Abdomen: soft, non-tender; no masses, no organomegaly Extremities: extremities normal, atraumatic, no cyanosis or edema Skin: skin color, texture, turgor normal. No rashes or lesions Lymph nodes: cervical, supraclavicular, and axillary nodes normal. Neurologic: grossly  normal  Pelvic: External genitalia:  no lesions              No abnormal inguinal nodes palpated.              Urethra:  normal appearing urethra with no masses, tenderness or lesions              Bartholins and Skenes: normal                 Vagina: normal appearing vagina with normal color and discharge, no lesions              Cervix: absent.  Atrophy changes noted.              Pap taken: no Bimanual Exam:  Uterus:  absent              Adnexa: no mass, fullness, tenderness              Rectal exam: Yes.  .  Confirms.              Anus:  normal sphincter tone, no lesions  Chaperone was present for exam:  Warren Lacy, CMA  ASSESSMENT: Well woman visit with gynecologic exam. Status post TVH for abnormal uterine bleeding in 2001. Ovaries remain.  Menopausal female.  Vaginal atrophy. Osteopenia.   FH colon cancer in mother.  Hx melanoma. Health education counseling.   PLAN: Mammogram screening discussed. Self breast awareness reviewed. Pap and HRV collected:  no.   Guidelines for Calcium, Vitamin D, regular exercise program including cardiovascular and weight bearing exercise. Medication refills:  NA [facility].  Labs and vaccines with PCP.  We reviewed signs and symptoms of pelvic organ prolapse that can indicate a change in anatomy or change in function of bladder/vagina/rectum.   Pelvic floor therapy mentioned as a nonsurgical treatment.  Follow up:  2 years and prn.    Additional counseling given.  yes. 30 min  total time was spent for this patient encounter, including preparation, face-to-face counseling with the patient, coordination of care, and documentation of the encounter in addition to doing the well woman visit with gynecologic exam.

## 2023-10-12 DIAGNOSIS — E785 Hyperlipidemia, unspecified: Secondary | ICD-10-CM | POA: Diagnosis not present

## 2023-10-12 DIAGNOSIS — Z1159 Encounter for screening for other viral diseases: Secondary | ICD-10-CM | POA: Diagnosis not present

## 2023-10-12 DIAGNOSIS — R7303 Prediabetes: Secondary | ICD-10-CM | POA: Diagnosis not present

## 2023-10-12 DIAGNOSIS — Z0000 Encounter for general adult medical examination without abnormal findings: Secondary | ICD-10-CM | POA: Diagnosis not present

## 2023-10-12 DIAGNOSIS — Z23 Encounter for immunization: Secondary | ICD-10-CM | POA: Diagnosis not present

## 2023-10-15 ENCOUNTER — Ambulatory Visit (INDEPENDENT_AMBULATORY_CARE_PROVIDER_SITE_OTHER): Payer: BC Managed Care – PPO | Admitting: Obstetrics and Gynecology

## 2023-10-15 ENCOUNTER — Encounter: Payer: Self-pay | Admitting: Obstetrics and Gynecology

## 2023-10-15 VITALS — BP 128/72 | HR 75 | Ht 64.5 in | Wt 120.0 lb

## 2023-10-15 DIAGNOSIS — Z780 Asymptomatic menopausal state: Secondary | ICD-10-CM

## 2023-10-15 DIAGNOSIS — M8580 Other specified disorders of bone density and structure, unspecified site: Secondary | ICD-10-CM

## 2023-10-15 DIAGNOSIS — Z01419 Encounter for gynecological examination (general) (routine) without abnormal findings: Secondary | ICD-10-CM | POA: Diagnosis not present

## 2023-10-15 DIAGNOSIS — Z719 Counseling, unspecified: Secondary | ICD-10-CM | POA: Diagnosis not present

## 2023-10-15 NOTE — Patient Instructions (Signed)

## 2023-11-08 ENCOUNTER — Other Ambulatory Visit: Payer: Self-pay | Admitting: Obstetrics and Gynecology

## 2023-11-08 ENCOUNTER — Encounter: Payer: Self-pay | Admitting: Obstetrics and Gynecology

## 2023-11-08 DIAGNOSIS — E2839 Other primary ovarian failure: Secondary | ICD-10-CM

## 2023-11-08 DIAGNOSIS — Z780 Asymptomatic menopausal state: Secondary | ICD-10-CM

## 2023-11-08 DIAGNOSIS — Z0000 Encounter for general adult medical examination without abnormal findings: Secondary | ICD-10-CM

## 2023-11-08 NOTE — Progress Notes (Signed)
 BMD [facility].

## 2023-11-08 NOTE — Telephone Encounter (Signed)
 Dr. Edward Jolly -please review BMD order and Dx of menopause.

## 2024-01-03 DIAGNOSIS — K080 Exfoliation of teeth due to systemic causes: Secondary | ICD-10-CM | POA: Diagnosis not present

## 2024-01-24 DIAGNOSIS — R109 Unspecified abdominal pain: Secondary | ICD-10-CM | POA: Diagnosis not present

## 2024-04-03 ENCOUNTER — Ambulatory Visit
Admission: RE | Admit: 2024-04-03 | Discharge: 2024-04-03 | Disposition: A | Discharge status: Home / Self Care | Source: Ambulatory Visit | Attending: Obstetrics and Gynecology | Admitting: Obstetrics and Gynecology

## 2024-04-03 DIAGNOSIS — Z1231 Encounter for screening mammogram for malignant neoplasm of breast: Secondary | ICD-10-CM | POA: Diagnosis not present

## 2024-04-03 DIAGNOSIS — Z0000 Encounter for general adult medical examination without abnormal findings: Secondary | ICD-10-CM

## 2024-04-08 ENCOUNTER — Ambulatory Visit: Payer: Self-pay | Admitting: Obstetrics and Gynecology

## 2024-04-08 DIAGNOSIS — Z23 Encounter for immunization: Secondary | ICD-10-CM | POA: Diagnosis not present

## 2024-07-22 ENCOUNTER — Other Ambulatory Visit

## 2024-07-29 DIAGNOSIS — K080 Exfoliation of teeth due to systemic causes: Secondary | ICD-10-CM | POA: Diagnosis not present

## 2024-10-21 ENCOUNTER — Ambulatory Visit (HOSPITAL_BASED_OUTPATIENT_CLINIC_OR_DEPARTMENT_OTHER): Payer: Self-pay
